# Patient Record
Sex: Female | Born: 1980 | Race: White | Hispanic: No | Marital: Married | Smoking: Never smoker
Health system: Southern US, Community
[De-identification: ages and names within clinical notes are randomized; demographics above are authoritative.]

## PROBLEM LIST (undated history)

## (undated) DIAGNOSIS — F509 Eating disorder, unspecified: Secondary | ICD-10-CM

## (undated) DIAGNOSIS — R51 Headache: Secondary | ICD-10-CM

## (undated) DIAGNOSIS — R519 Headache, unspecified: Secondary | ICD-10-CM

## (undated) DIAGNOSIS — N390 Urinary tract infection, site not specified: Secondary | ICD-10-CM

## (undated) DIAGNOSIS — J302 Other seasonal allergic rhinitis: Secondary | ICD-10-CM

## (undated) DIAGNOSIS — M40209 Unspecified kyphosis, site unspecified: Secondary | ICD-10-CM

## (undated) HISTORY — PX: OTHER SURGICAL HISTORY: SHX169

## (undated) HISTORY — DX: Headache, unspecified: R51.9

## (undated) HISTORY — DX: Eating disorder, unspecified: F50.9

## (undated) HISTORY — DX: Unspecified kyphosis, site unspecified: M40.209

## (undated) HISTORY — DX: Other seasonal allergic rhinitis: J30.2

## (undated) HISTORY — DX: Urinary tract infection, site not specified: N39.0

## (undated) HISTORY — DX: Headache: R51

---

## 2014-08-03 ENCOUNTER — Emergency Department (HOSPITAL_COMMUNITY)
Admission: EM | Admit: 2014-08-03 | Discharge: 2014-08-03 | Disposition: A | Discharge status: Home / Self Care | Payer: BLUE CROSS/BLUE SHIELD | Attending: Emergency Medicine | Admitting: Emergency Medicine

## 2014-08-03 ENCOUNTER — Encounter (HOSPITAL_COMMUNITY): Payer: Self-pay

## 2014-08-03 DIAGNOSIS — K625 Hemorrhage of anus and rectum: Secondary | ICD-10-CM | POA: Diagnosis present

## 2014-08-03 DIAGNOSIS — Z79899 Other long term (current) drug therapy: Secondary | ICD-10-CM | POA: Diagnosis not present

## 2014-08-03 DIAGNOSIS — R5383 Other fatigue: Secondary | ICD-10-CM | POA: Diagnosis not present

## 2014-08-03 LAB — CBC
HCT: 40.1 % (ref 36.0–46.0)
Hemoglobin: 13.4 g/dL (ref 12.0–15.0)
MCH: 30.6 pg (ref 26.0–34.0)
MCHC: 33.4 g/dL (ref 30.0–36.0)
MCV: 91.6 fL (ref 78.0–100.0)
PLATELETS: 169 10*3/uL (ref 150–400)
RBC: 4.38 MIL/uL (ref 3.87–5.11)
RDW: 12.9 % (ref 11.5–15.5)
WBC: 5.9 10*3/uL (ref 4.0–10.5)

## 2014-08-03 LAB — COMPREHENSIVE METABOLIC PANEL
ALBUMIN: 4.1 g/dL (ref 3.5–5.2)
ALT: 14 U/L (ref 0–35)
ANION GAP: 5 (ref 5–15)
AST: 18 U/L (ref 0–37)
Alkaline Phosphatase: 40 U/L (ref 39–117)
BUN: 13 mg/dL (ref 6–23)
CO2: 28 mmol/L (ref 19–32)
Calcium: 9.2 mg/dL (ref 8.4–10.5)
Chloride: 104 mmol/L (ref 96–112)
Creatinine, Ser: 0.77 mg/dL (ref 0.50–1.10)
Glucose, Bld: 99 mg/dL (ref 70–99)
Potassium: 3.7 mmol/L (ref 3.5–5.1)
Sodium: 137 mmol/L (ref 135–145)
TOTAL PROTEIN: 6.7 g/dL (ref 6.0–8.3)
Total Bilirubin: 0.3 mg/dL (ref 0.3–1.2)

## 2014-08-03 LAB — TYPE AND SCREEN
ABO/RH(D): A POS
Antibody Screen: NEGATIVE

## 2014-08-03 LAB — POC OCCULT BLOOD, ED: Fecal Occult Bld: POSITIVE — AB

## 2014-08-03 NOTE — ED Notes (Signed)
MD in room

## 2014-08-03 NOTE — Discharge Instructions (Signed)

## 2014-08-03 NOTE — ED Provider Notes (Signed)
CSN: [id number]     Arrival date & time 08/03/14  2206 History   First MD Initiated Contact with Patient 08/03/14 2214     Chief Complaint  Patient presents with  . Rectal Bleeding     (Consider location/radiation/quality/duration/timing/severity/associated sxs/prior Treatment) HPI Comments: [name] no significant PMHx presents with CC rectal bleeding.  Pt states symptoms started tonight.  She states she has had BRBPR tonight, approximately 1-2 hours prior to arrival.  Blood is scant, no active bleeding currently.  Pt states she did have some abdominal discomfort earlier, but feels this was 2/2 nerves.  Denies fever, chills, lightheadedness, syncope, hematemesis, diarrhea, constipation, bruising, or any other symptoms.  Pt states she has had dark stool since late January, but BRBPR just started tonight.  Denies use of OTC NSAIDs, denies hx of GI bleeding, denies hx of bleeding disorder, denies FMHx of GI bleeding, or CA.  No other concerns.    Patient is a 34 y.o. female presenting with hematochezia. The history is provided by the patient.  Rectal Bleeding Quality:  Bright red Amount:  Scant Duration:  2 hours Timing:  Intermittent Progression:  Unchanged Chronicity:  New Context: spontaneously   Context: not anal fissures, not anal penetration, not constipation, not defecation, not diarrhea, not foreign body, not hemorrhoids, not rectal injury and not rectal pain   Similar prior episodes: no   Relieved by:  Nothing Worsened by:  Nothing tried Ineffective treatments:  None tried Associated symptoms: no abdominal pain, no dizziness, no fever, no hematemesis, no light-headedness, no loss of consciousness, no recent illness and no vomiting   Risk factors: no anticoagulant use, no hx of colorectal cancer, no hx of colorectal surgery, no hx of IBD, no liver disease, no NSAID use and no steroid use     History reviewed. No pertinent past medical history. History reviewed. No pertinent past  surgical history. History reviewed. No pertinent family history. History  Substance Use Topics  . Smoking status: Never Smoker   . Smokeless tobacco: Not on file  . Alcohol Use: Yes   OB History    No data available     Review of Systems  Constitutional: Positive for fatigue. Negative for fever.  Respiratory: Negative for cough and shortness of breath.   Cardiovascular: Negative for chest pain.  Gastrointestinal: Positive for blood in stool, hematochezia and anal bleeding. Negative for nausea, vomiting, abdominal pain, diarrhea, constipation and hematemesis.  Genitourinary: Negative for dysuria, hematuria, vaginal bleeding and vaginal discharge.  Musculoskeletal: Negative for myalgias.  Skin: Negative for rash.  Neurological: Negative for dizziness, loss of consciousness, weakness and light-headedness.  Hematological: Negative for adenopathy. Does not bruise/bleed easily.      Allergies  Review of patient's allergies indicates no known allergies.  Home Medications   Prior to Admission medications   Medication Sig Start Date End Date Taking? Authorizing Provider  levonorgestrel (MIRENA) 20 MCG/24HR IUD 1 each by Intrauterine route once. 2014   Yes Historical Provider, MD   BP 121/85 mmHg  Pulse 85  Temp(Src) 98.4 F (36.9 C) (Oral)  Resp 16  Ht 5\' 11"  (1.803 m)  Wt 200 lb (90.719 kg)  BMI 27.91 kg/m2  SpO2 100% Physical Exam  Constitutional: She is oriented to person, place, and time. She appears well-developed and well-nourished.  HENT:  Head: Normocephalic and atraumatic.  Right Ear: External ear normal.  Left Ear: External ear normal.  Mouth/Throat: Oropharynx is clear and moist.  Eyes: Conjunctivae and EOM are normal.  Pupils are equal, round, and reactive to light.  Neck: Normal range of motion. Neck supple.  Cardiovascular: Normal rate, regular rhythm, normal heart sounds and intact distal pulses.   Pulmonary/Chest: Effort normal and breath sounds normal. No  respiratory distress. She has no wheezes. She has no rales. She exhibits no tenderness.  Abdominal: Soft. Bowel sounds are normal. She exhibits no distension and no mass. There is no tenderness. There is no rebound and no guarding.  Genitourinary:  Grossly positive BRBPR, no active bleeding present, no hemorrhoids or anal fissure, no masses palpated.    Musculoskeletal: Normal range of motion.  Neurological: She is alert and oriented to person, place, and time.  Skin: Skin is warm and dry.  Nursing note and vitals reviewed.   ED Course  Procedures (including critical care time) Labs Review Labs Reviewed  POC OCCULT BLOOD, ED - Abnormal; Notable for the following:    Fecal Occult Bld POSITIVE (*)    All other components within normal limits  CBC  COMPREHENSIVE METABOLIC PANEL  TYPE AND SCREEN  ABO/RH    Imaging Review No results found.   EKG Interpretation None      MDM   Final diagnoses:  Rectal bleeding   [name] no significant PMHx presents with CC rectal bleeding.   Physical exam as above.  VS WNL, BP 121/85.  Pt looks well, nontoxic appearing, joking in room, well perfused.  Denies presyncopal symptoms.  Rectal exam positive for gross blood, but no active bleeding, masses, fissures, hemorrhoids.  Hgb is  13.4.  Pt states stool was dark prior to this, so question slow upper GI bleed.  No risk factors for GI bleed by history above.  Pt stable for d/c home at this time.  Will have patient follow up with GI here, call for appointment tomorrow for outpatient workup of rectal bleeding.  Pt given bleeding precautions, and strict return precautions including syncope, lightheadedness, massive active bleeding.  Jon Gills  I discussed this patient with my attending Dr Jeraldine Loots.   Jon Gills, MD 08/04/14 1191  Gerhard Munch, MD 08/05/14 [phone]

## 2014-08-03 NOTE — ED Notes (Signed)
Pt reports 1 episode of rectal bleeding tonight.  Sts she has noticed since the end of January that her bowel movements have been dark.  Also reports feeling tired.  Sts she has been under a lot of stress since moving here from [place].

## 2014-08-04 LAB — ABO/RH: ABO/RH(D): A POS

## 2015-04-06 ENCOUNTER — Encounter: Payer: Self-pay | Admitting: Adult Health

## 2015-04-06 ENCOUNTER — Ambulatory Visit (INDEPENDENT_AMBULATORY_CARE_PROVIDER_SITE_OTHER): Payer: BLUE CROSS/BLUE SHIELD | Admitting: Adult Health

## 2015-04-06 VITALS — BP 100/70 | Temp 98.7°F | Ht 69.5 in | Wt 198.1 lb

## 2015-04-06 DIAGNOSIS — Z23 Encounter for immunization: Secondary | ICD-10-CM | POA: Diagnosis not present

## 2015-04-06 DIAGNOSIS — Z0000 Encounter for general adult medical examination without abnormal findings: Secondary | ICD-10-CM | POA: Diagnosis not present

## 2015-04-06 DIAGNOSIS — B379 Candidiasis, unspecified: Secondary | ICD-10-CM | POA: Diagnosis not present

## 2015-04-06 MED ORDER — FLUCONAZOLE 150 MG PO TABS: 150.0000 mg | ORAL_TABLET | Freq: Once | ORAL | Status: DC

## 2015-04-06 NOTE — Progress Notes (Signed)
HPI:  Tina Mueller is here to establish care.  Last PCP and physical: June 2016  Has the following chronic problems that require follow up and concerns today:  Yeast Infection Her only concern today is that she thinks that she is getting a yeast infection. Was recently placed on Amoxicillin following a dental procedure. She denies any itching but has discharge.   ROS negative for unless reported above: fevers, chills,feeling poorly, unintentional weight loss, hearing or vision loss, chest pain, palpitations, leg claudication, struggling to breath,Not feeling congested in the chest, no orthopenia, no cough,no wheezing, normal appetite, no soft tissue swelling, no hemoptysis, melena, hematochezia, hematuria, falls, loc, si, or thoughts of self harm.  Immunizations:UTD Diet: Eats healthy Exercise:Walks outside every day with the dog. No regular exercise Pap Smear: She does not have an GYN. Will get one. Had one abnormal, none since.   Past Medical History  Diagnosis Date  . Eating disorder   . Frequent headaches   . Seasonal allergies   . UTI (urinary tract infection)     History reviewed. No pertinent past surgical history.  Family History  Problem Relation Age of Onset  . Arthritis    . Breast cancer    . Lung cancer    . Heart disease    . Hypertension      Social History   Social History  . Marital Status: Married    Spouse Name: N/A  . Number of Children: N/A  . Years of Education: N/A   Social History Main Topics  . Smoking status: Never Smoker   . Smokeless tobacco: None  . Alcohol Use: Yes  . Drug Use: None  . Sexual Activity: Not Asked   Other Topics Concern  . None   Social History Narrative     Current outpatient prescriptions:  .  levonorgestrel (MIRENA) 20 MCG/24HR IUD, 1 each by Intrauterine route once. 2014, Disp: , Rfl:   EXAM:  Filed Vitals:   04/06/15 0954  BP: 100/70  Temp: 98.7 F (37.1 C)    Body mass index is 28.84  kg/(m^2).  GENERAL: vitals reviewed and listed above, alert, oriented, appears well hydrated and in no acute distress.  HEENT: atraumatic, conjunttiva clear, no obvious abnormalities on inspection of external nose and ears  NECK: Neck is soft and supple without masses, no adenopathy or thyromegaly, trachea midline, no JVD. Normal range of motion.   LUNGS: clear to auscultation bilaterally, no wheezes, rales or rhonchi, good air movement  CV: Regular rate and rhythm, normal S1/S2, no audible murmurs, gallops, or rubs. No carotid bruit and no peripheral edema.   MS: moves all extremities without noticeable abnormality. No edema noted  Abd: soft/nontender/nondistended/normal bowel sounds   Skin: warm and dry, no rash   Extremities: No clubbing, cyanosis, or edema. Capillary refill is WNL. Pulses intact bilaterally in upper and lower extremities.   Neuro: CN II-XII intact, sensation and reflexes normal throughout, 5/5 muscle strength in bilateral upper and lower extremities. Normal finger to nose. Normal rapid alternating movements. Normal romberg. No pronator drift.   PSYCH: pleasant and cooperative, no obvious depression or anxiety  ASSESSMENT AND PLAN:  1. Routine general medical examination at a health care facility - Follow up in 10/2015 for CPE - Follow up sooner if needed - Stressed importance of regular exercise and healthy diet with portion control.  - Review labs from previous physical, nothing abnormal.  ( scanned into chart)  2. Yeast infection - fluconazole (DIFLUCAN) 150  MG tablet; Take 1 tablet (150 mg total) by mouth once.  Dispense: 1 tablet; Refill: 1 - Can take an additional dose in 72 hours  3. Encounter for immunization - Flu vaccination given     -We reviewed the PMH, PSH, FH, SH, Meds and Allergies. -We provided refills for any medications we will prescribe as needed. -We addressed current concerns per orders and patient instructions. -We have asked for  records for pertinent exams, studies, vaccines and notes from previous providers. -We have advised patient to follow up per instructions below.   -Patient advised to return or notify a provider immediately if symptoms worsen or persist or new concerns arise.   [name], AGNP

## 2015-04-06 NOTE — Patient Instructions (Addendum)
It was a pleasure meeting you today!  Please follow up with me in May 2017 for your next physical. Follow up sooner if needed  Work on diet and exercise.   I have sent in a prescription for Fluconazole, take one pill and if needed another in 72 hours.     Health Maintenance, Female Adopting a healthy lifestyle and getting preventive care can go a long way to promote health and wellness. Talk with your health care provider about what schedule of regular examinations is right for you. This is a good chance for you to check in with your provider about disease prevention and staying healthy. In between checkups, there are plenty of things you can do on your own. Experts have done a lot of research about which lifestyle changes and preventive measures are most likely to keep you healthy. Ask your health care provider for more information. WEIGHT AND DIET  Eat a healthy diet  Be sure to include plenty of vegetables, fruits, low-fat dairy products, and lean protein.  Do not eat a lot of foods high in solid fats, added sugars, or salt.  Get regular exercise. This is one of the most important things you can do for your health.  Most adults should exercise for at least 150 minutes each week. The exercise should increase your heart rate and make you sweat (moderate-intensity exercise).  Most adults should also do strengthening exercises at least twice a week. This is in addition to the moderate-intensity exercise.  Maintain a healthy weight  Body mass index (BMI) is a measurement that can be used to identify possible weight problems. It estimates body fat based on height and weight. Your health care provider can help determine your BMI and help you achieve or maintain a healthy weight.  For females 17 years of age and older:   A BMI below 18.5 is considered underweight.  A BMI of 18.5 to 24.9 is normal.  A BMI of 25 to 29.9 is considered overweight.  A BMI of 30 and above is considered  obese.  Watch levels of cholesterol and blood lipids  You should start having your blood tested for lipids and cholesterol at 33 years of age, then have this test every 5 years.  You may need to have your cholesterol levels checked more often if:  Your lipid or cholesterol levels are high.  You are older than 33 years of age.  You are at high risk for heart disease.  CANCER SCREENING   Lung Cancer  Lung cancer screening is recommended for adults 58-43 years old who are at high risk for lung cancer because of a history of smoking.  A yearly low-dose CT scan of the lungs is recommended for people who:  Currently smoke.  Have quit within the past 15 years.  Have at least a 30-pack-year history of smoking. A pack year is smoking an average of one pack of cigarettes a day for 1 year.  Yearly screening should continue until it has been 15 years since you quit.  Yearly screening should stop if you develop a health problem that would prevent you from having lung cancer treatment.  Breast Cancer  Practice breast self-awareness. This means understanding how your breasts normally appear and feel.  It also means doing regular breast self-exams. Let your health care provider know about any changes, no matter how small.  If you are in your 20s or 30s, you should have a clinical breast exam (CBE) by a health  care provider every 1-3 years as part of a regular health exam.  If you are 34 or older, have a CBE every year. Also consider having a breast X-ray (mammogram) every year.  If you have a family history of breast cancer, talk to your health care provider about genetic screening.  If you are at high risk for breast cancer, talk to your health care provider about having an MRI and a mammogram every year.  Breast cancer gene (BRCA) assessment is recommended for women who have family members with BRCA-related cancers. BRCA-related cancers  include:  Breast.  Ovarian.  Tubal.  Peritoneal cancers.  Results of the assessment will determine the need for genetic counseling and BRCA1 and BRCA2 testing. Cervical Cancer Your health care provider may recommend that you be screened regularly for cancer of the pelvic organs (ovaries, uterus, and vagina). This screening involves a pelvic examination, including checking for microscopic changes to the surface of your cervix (Pap test). You may be encouraged to have this screening done every 3 years, beginning at age 21.  For women ages 89-65, health care providers may recommend pelvic exams and Pap testing every 3 years, or they may recommend the Pap and pelvic exam, combined with testing for human papilloma virus (HPV), every 5 years. Some types of HPV increase your risk of cervical cancer. Testing for HPV may also be done on women of any age with unclear Pap test results.  Other health care providers may not recommend any screening for nonpregnant women who are considered low risk for pelvic cancer and who do not have symptoms. Ask your health care provider if a screening pelvic exam is right for you.  If you have had past treatment for cervical cancer or a condition that could lead to cancer, you need Pap tests and screening for cancer for at least 20 years after your treatment. If Pap tests have been discontinued, your risk factors (such as having a new sexual partner) need to be reassessed to determine if screening should resume. Some women have medical problems that increase the chance of getting cervical cancer. In these cases, your health care provider may recommend more frequent screening and Pap tests. Colorectal Cancer  This type of cancer can be detected and often prevented.  Routine colorectal cancer screening usually begins at 33 years of age and continues through 33 years of age.  Your health care provider may recommend screening at an earlier age if you have risk factors for  colon cancer.  Your health care provider may also recommend using home test kits to check for hidden blood in the stool.  A small camera at the end of a tube can be used to examine your colon directly (sigmoidoscopy or colonoscopy). This is done to check for the earliest forms of colorectal cancer.  Routine screening usually begins at age 36.  Direct examination of the colon should be repeated every 5-10 years through 33 years of age. However, you may need to be screened more often if early forms of precancerous polyps or small growths are found. Skin Cancer  Check your skin from head to toe regularly.  Tell your health care provider about any new moles or changes in moles, especially if there is a change in a mole's shape or color.  Also tell your health care provider if you have a mole that is larger than the size of a pencil eraser.  Always use sunscreen. Apply sunscreen liberally and repeatedly throughout the day.  Protect  yourself by wearing long sleeves, pants, a wide-brimmed hat, and sunglasses whenever you are outside. HEART DISEASE, DIABETES, AND HIGH BLOOD PRESSURE   High blood pressure causes heart disease and increases the risk of stroke. High blood pressure is more likely to develop in:  People who have blood pressure in the high end of the normal range (130-139/85-89 mm Hg).  People who are overweight or obese.  People who are African American.  If you are 54-65 years of age, have your blood pressure checked every 3-5 years. If you are 81 years of age or older, have your blood pressure checked every year. You should have your blood pressure measured twice--once when you are at a hospital or clinic, and once when you are not at a hospital or clinic. Record the average of the two measurements. To check your blood pressure when you are not at a hospital or clinic, you can use:  An automated blood pressure machine at a pharmacy.  A home blood pressure monitor.  If you  are between 81 years and 18 years old, ask your health care provider if you should take aspirin to prevent strokes.  Have regular diabetes screenings. This involves taking a blood sample to check your fasting blood sugar level.  If you are at a normal weight and have a low risk for diabetes, have this test once every three years after 33 years of age.  If you are overweight and have a high risk for diabetes, consider being tested at a younger age or more often. PREVENTING INFECTION  Hepatitis B  If you have a higher risk for hepatitis B, you should be screened for this virus. You are considered at high risk for hepatitis B if:  You were born in a country where hepatitis B is common. Ask your health care provider which countries are considered high risk.  Your parents were born in a high-risk country, and you have not been immunized against hepatitis B (hepatitis B vaccine).  You have HIV or AIDS.  You use needles to inject street drugs.  You live with someone who has hepatitis B.  You have had sex with someone who has hepatitis B.  You get hemodialysis treatment.  You take certain medicines for conditions, including cancer, organ transplantation, and autoimmune conditions. Hepatitis C  Blood testing is recommended for:  Everyone born from [birth year] through 1964.  Anyone with known risk factors for hepatitis C. Sexually transmitted infections (STIs)  You should be screened for sexually transmitted infections (STIs) including gonorrhea and chlamydia if:  You are sexually active and are younger than 33 years of age.  You are older than 33 years of age and your health care provider tells you that you are at risk for this type of infection.  Your sexual activity has changed since you were last screened and you are at an increased risk for chlamydia or gonorrhea. Ask your health care provider if you are at risk.  If you do not have HIV, but are at risk, it may be recommended that you  take a prescription medicine daily to prevent HIV infection. This is called pre-exposure prophylaxis (PrEP). You are considered at risk if:  You are sexually active and do not regularly use condoms or know the HIV status of your partner(s).  You take drugs by injection.  You are sexually active with a partner who has HIV. Talk with your health care provider about whether you are at high risk of being infected with  HIV. If you choose to begin PrEP, you should first be tested for HIV. You should then be tested every 3 months for as long as you are taking PrEP.  PREGNANCY   If you are premenopausal and you may become pregnant, ask your health care provider about preconception counseling.  If you may become pregnant, take 400 to 800 micrograms (mcg) of folic acid every day.  If you want to prevent pregnancy, talk to your health care provider about birth control (contraception). OSTEOPOROSIS AND MENOPAUSE   Osteoporosis is a disease in which the bones lose minerals and strength with aging. This can result in serious bone fractures. Your risk for osteoporosis can be identified using a bone density scan.  If you are 53 years of age or older, or if you are at risk for osteoporosis and fractures, ask your health care provider if you should be screened.  Ask your health care provider whether you should take a calcium or vitamin D supplement to lower your risk for osteoporosis.  Menopause may have certain physical symptoms and risks.  Hormone replacement therapy may reduce some of these symptoms and risks. Talk to your health care provider about whether hormone replacement therapy is right for you.  HOME CARE INSTRUCTIONS   Schedule regular health, dental, and eye exams.  Stay current with your immunizations.   Do not use any tobacco products including cigarettes, chewing tobacco, or electronic cigarettes.  If you are pregnant, do not drink alcohol.  If you are breastfeeding, limit how  much and how often you drink alcohol.  Limit alcohol intake to no more than 1 drink per day for nonpregnant women. One drink equals 12 ounces of beer, 5 ounces of wine, or 1 ounces of hard liquor.  Do not use street drugs.  Do not share needles.  Ask your health care provider for help if you need support or information about quitting drugs.  Tell your health care provider if you often feel depressed.  Tell your health care provider if you have ever been abused or do not feel safe at home.   This information is not intended to replace advice given to you by your health care provider. Make sure you discuss any questions you have with your health care provider.   Document Released: 12/19/2010 Document Revised: 06/26/2014 Document Reviewed: 05/07/2013 Elsevier Interactive Patient Education [employer].

## 2015-12-27 LAB — OB RESULTS CONSOLE ANTIBODY SCREEN: ANTIBODY SCREEN: NEGATIVE

## 2015-12-27 LAB — OB RESULTS CONSOLE HIV ANTIBODY (ROUTINE TESTING): HIV: NONREACTIVE

## 2015-12-27 LAB — OB RESULTS CONSOLE HEPATITIS B SURFACE ANTIGEN: HEP B S AG: NEGATIVE

## 2015-12-27 LAB — OB RESULTS CONSOLE GC/CHLAMYDIA
CHLAMYDIA, DNA PROBE: NEGATIVE
Gonorrhea: NEGATIVE

## 2015-12-27 LAB — OB RESULTS CONSOLE ABO/RH: RH TYPE: POSITIVE

## 2015-12-27 LAB — OB RESULTS CONSOLE RUBELLA ANTIBODY, IGM: Rubella: IMMUNE

## 2015-12-27 LAB — OB RESULTS CONSOLE RPR: RPR: NONREACTIVE

## 2016-06-19 NOTE — L&D Delivery Note (Signed)
Delivery Note At 8:06 PM a viable female was delivered via Vaginal, Spontaneous Delivery (PresentationOA: ;  ).  APGAR: , ; weight  .   Placenta status:spont >>intact , .  Cord:  with the following complications: .  Cord pH: sent Mod tight nuchal cord X 1>>cclamped + cut Anesthesia: epid + loc  Episiotomy: None Lacerations: 2nd degree;Perineal, bilat periurethral lacs Suture Repair: 3.0 vicryl rapide Est. Blood Loss (mL): 300  Mom to postpartum.  Baby to Couplet care / Skin to Skin.  Tina Mueller,Alie Hardgrove M 08/04/2016, 8:34 PM

## 2016-07-25 ENCOUNTER — Encounter (HOSPITAL_COMMUNITY): Payer: Self-pay | Admitting: *Deleted

## 2016-07-25 ENCOUNTER — Telehealth (HOSPITAL_COMMUNITY): Payer: Self-pay | Admitting: *Deleted

## 2016-07-25 LAB — OB RESULTS CONSOLE GBS: GBS: POSITIVE

## 2016-07-25 NOTE — Telephone Encounter (Signed)
Preadmission screen  

## 2016-08-03 NOTE — H&P (Signed)
Tina Mueller  DICTATION # [id number] CSN# [id number]   Tina PicaHOLLAND,[name], MD 08/03/2016 11:04 AM

## 2016-08-04 ENCOUNTER — Inpatient Hospital Stay (HOSPITAL_COMMUNITY): Payer: BLUE CROSS/BLUE SHIELD | Admitting: Anesthesiology

## 2016-08-04 ENCOUNTER — Encounter (HOSPITAL_COMMUNITY): Payer: Self-pay

## 2016-08-04 ENCOUNTER — Inpatient Hospital Stay (HOSPITAL_COMMUNITY)
Admission: RE | Admit: 2016-08-04 | Discharge: 2016-08-06 | DRG: 775 | Disposition: A | Discharge status: Home / Self Care | Payer: BLUE CROSS/BLUE SHIELD | Source: Ambulatory Visit | Attending: Obstetrics and Gynecology | Admitting: Obstetrics and Gynecology

## 2016-08-04 DIAGNOSIS — O99214 Obesity complicating childbirth: Secondary | ICD-10-CM | POA: Diagnosis present

## 2016-08-04 DIAGNOSIS — Z6839 Body mass index (BMI) 39.0-39.9, adult: Secondary | ICD-10-CM

## 2016-08-04 DIAGNOSIS — Z3493 Encounter for supervision of normal pregnancy, unspecified, third trimester: Secondary | ICD-10-CM | POA: Diagnosis present

## 2016-08-04 DIAGNOSIS — O99824 Streptococcus B carrier state complicating childbirth: Secondary | ICD-10-CM | POA: Diagnosis present

## 2016-08-04 DIAGNOSIS — Z3490 Encounter for supervision of normal pregnancy, unspecified, unspecified trimester: Secondary | ICD-10-CM

## 2016-08-04 DIAGNOSIS — Z3A40 40 weeks gestation of pregnancy: Secondary | ICD-10-CM | POA: Diagnosis not present

## 2016-08-04 HISTORY — DX: Encounter for supervision of normal pregnancy, unspecified, unspecified trimester: Z34.90

## 2016-08-04 LAB — CBC
HCT: 38.9 % (ref 36.0–46.0)
HEMATOCRIT: 38.1 % (ref 36.0–46.0)
HEMOGLOBIN: 13.2 g/dL (ref 12.0–15.0)
Hemoglobin: 12.6 g/dL (ref 12.0–15.0)
MCH: 30.1 pg (ref 26.0–34.0)
MCH: 30.1 pg (ref 26.0–34.0)
MCHC: 33.1 g/dL (ref 30.0–36.0)
MCHC: 33.9 g/dL (ref 30.0–36.0)
MCV: 90.9 fL (ref 78.0–100.0)
MCV: 88.8 fL (ref 78.0–100.0)
PLATELETS: 125 10*3/uL — AB (ref 150–400)
Platelets: 132 10*3/uL — ABNORMAL LOW (ref 150–400)
RBC: 4.19 MIL/uL (ref 3.87–5.11)
RBC: 4.38 MIL/uL (ref 3.87–5.11)
RDW: 14 % (ref 11.5–15.5)
RDW: 14 % (ref 11.5–15.5)
WBC: 10.4 10*3/uL (ref 4.0–10.5)
WBC: 13.2 10*3/uL — AB (ref 4.0–10.5)

## 2016-08-04 LAB — TYPE AND SCREEN
ABO/RH(D): A POS
ANTIBODY SCREEN: NEGATIVE

## 2016-08-04 LAB — ABO/RH: ABO/RH(D): A POS

## 2016-08-04 MED ORDER — COCONUT OIL OIL
1.0000 "application " | TOPICAL_OIL | Status: DC | PRN
  Administered 2016-08-06: 1 via TOPICAL
  Filled 2016-08-04: qty 120

## 2016-08-04 MED ORDER — ACETAMINOPHEN 325 MG PO TABS
650.0000 mg | ORAL_TABLET | ORAL | Status: DC | PRN
  Administered 2016-08-05 (×2): 650 mg via ORAL
  Filled 2016-08-04 (×2): qty 2

## 2016-08-04 MED ORDER — DIPHENHYDRAMINE HCL 25 MG PO CAPS: 25.0000 mg | ORAL_CAPSULE | Freq: Four times a day (QID) | ORAL | Status: DC | PRN

## 2016-08-04 MED ORDER — FENTANYL 2.5 MCG/ML BUPIVACAINE 1/10 % EPIDURAL INFUSION (WH - ANES)
14.0000 mL/h | INTRAMUSCULAR | Status: DC | PRN
  Administered 2016-08-04 (×2): 14 mL/h via EPIDURAL
  Filled 2016-08-04 (×2): qty 100

## 2016-08-04 MED ORDER — OXYTOCIN 40 UNITS IN LACTATED RINGERS INFUSION - SIMPLE MED: 2.5000 [IU]/h | INTRAVENOUS | Status: DC

## 2016-08-04 MED ORDER — SIMETHICONE 80 MG PO CHEW
80.0000 mg | CHEWABLE_TABLET | ORAL | Status: DC | PRN
  Administered 2016-08-05: 80 mg via ORAL
  Filled 2016-08-04: qty 1

## 2016-08-04 MED ORDER — OXYCODONE-ACETAMINOPHEN 5-325 MG PO TABS: 1.0000 | ORAL_TABLET | ORAL | Status: DC | PRN

## 2016-08-04 MED ORDER — BISACODYL 10 MG RE SUPP: 10.0000 mg | Freq: Every day | RECTAL | Status: DC | PRN

## 2016-08-04 MED ORDER — FLEET ENEMA 7-19 GM/118ML RE ENEM: 1.0000 | ENEMA | Freq: Every day | RECTAL | Status: DC | PRN

## 2016-08-04 MED ORDER — OXYTOCIN BOLUS FROM INFUSION
500.0000 mL | Freq: Once | INTRAVENOUS | Status: AC
  Administered 2016-08-04: 500 mL via INTRAVENOUS

## 2016-08-04 MED ORDER — IBUPROFEN 800 MG PO TABS
800.0000 mg | ORAL_TABLET | Freq: Three times a day (TID) | ORAL | Status: DC | PRN
Start: 2016-08-04 — End: 2016-08-06
  Administered 2016-08-05 (×3): 800 mg via ORAL
  Filled 2016-08-04 (×3): qty 1

## 2016-08-04 MED ORDER — TETANUS-DIPHTH-ACELL PERTUSSIS 5-2.5-18.5 LF-MCG/0.5 IM SUSP: 0.5000 mL | Freq: Once | INTRAMUSCULAR | Status: DC

## 2016-08-04 MED ORDER — DIPHENHYDRAMINE HCL 50 MG/ML IJ SOLN: 12.5000 mg | INTRAMUSCULAR | Status: DC | PRN

## 2016-08-04 MED ORDER — TERBUTALINE SULFATE 1 MG/ML IJ SOLN
0.2500 mg | Freq: Once | INTRAMUSCULAR | Status: DC | PRN
  Filled 2016-08-04: qty 1

## 2016-08-04 MED ORDER — EPHEDRINE 5 MG/ML INJ
10.0000 mg | INTRAVENOUS | Status: DC | PRN
  Filled 2016-08-04: qty 4

## 2016-08-04 MED ORDER — FLEET ENEMA 7-19 GM/118ML RE ENEM: 1.0000 | ENEMA | RECTAL | Status: DC | PRN

## 2016-08-04 MED ORDER — ONDANSETRON HCL 4 MG PO TABS
4.0000 mg | ORAL_TABLET | ORAL | Status: DC | PRN
Start: 2016-08-04 — End: 2016-08-06

## 2016-08-04 MED ORDER — MISOPROSTOL 25 MCG QUARTER TABLET
25.0000 ug | ORAL_TABLET | ORAL | Status: DC | PRN
  Administered 2016-08-04 (×2): 25 ug via VAGINAL
  Filled 2016-08-04 (×2): qty 0.25
  Filled 2016-08-04: qty 1

## 2016-08-04 MED ORDER — ACETAMINOPHEN 325 MG PO TABS: 650.0000 mg | ORAL_TABLET | ORAL | Status: DC | PRN

## 2016-08-04 MED ORDER — ONDANSETRON HCL 4 MG/2ML IJ SOLN: 4.0000 mg | INTRAMUSCULAR | Status: DC | PRN

## 2016-08-04 MED ORDER — LIDOCAINE HCL (PF) 1 % IJ SOLN
30.0000 mL | INTRAMUSCULAR | Status: DC | PRN
  Administered 2016-08-04: 30 mL via SUBCUTANEOUS
  Filled 2016-08-04: qty 30

## 2016-08-04 MED ORDER — LACTATED RINGERS IV SOLN: 500.0000 mL | Freq: Once | INTRAVENOUS | Status: DC

## 2016-08-04 MED ORDER — LACTATED RINGERS IV SOLN
INTRAVENOUS | Status: DC
  Administered 2016-08-04 (×2): 125 mL/h via INTRAVENOUS

## 2016-08-04 MED ORDER — OXYCODONE-ACETAMINOPHEN 5-325 MG PO TABS: 2.0000 | ORAL_TABLET | ORAL | Status: DC | PRN

## 2016-08-04 MED ORDER — ONDANSETRON HCL 4 MG/2ML IJ SOLN: 4.0000 mg | Freq: Four times a day (QID) | INTRAMUSCULAR | Status: DC | PRN

## 2016-08-04 MED ORDER — WITCH HAZEL-GLYCERIN EX PADS: 1.0000 "application " | MEDICATED_PAD | CUTANEOUS | Status: DC | PRN

## 2016-08-04 MED ORDER — BENZOCAINE-MENTHOL 20-0.5 % EX AERO
1.0000 "application " | INHALATION_SPRAY | CUTANEOUS | Status: DC | PRN
  Filled 2016-08-04 (×2): qty 56

## 2016-08-04 MED ORDER — PRENATAL MULTIVITAMIN CH
1.0000 | ORAL_TABLET | Freq: Every day | ORAL | Status: DC
Start: 2016-08-05 — End: 2016-08-06
  Administered 2016-08-05: 1 via ORAL
  Filled 2016-08-04: qty 1

## 2016-08-04 MED ORDER — LIDOCAINE HCL (PF) 1 % IJ SOLN
INTRAMUSCULAR | Status: DC | PRN
  Administered 2016-08-04: 8 mL via EPIDURAL
  Administered 2016-08-04: 6 mL via EPIDURAL

## 2016-08-04 MED ORDER — SENNOSIDES-DOCUSATE SODIUM 8.6-50 MG PO TABS
2.0000 | ORAL_TABLET | ORAL | Status: DC
  Administered 2016-08-05: 2 via ORAL
  Filled 2016-08-04 (×2): qty 2

## 2016-08-04 MED ORDER — ZOLPIDEM TARTRATE 5 MG PO TABS: 5.0000 mg | ORAL_TABLET | Freq: Every evening | ORAL | Status: DC | PRN

## 2016-08-04 MED ORDER — DIBUCAINE 1 % RE OINT: 1.0000 "application " | TOPICAL_OINTMENT | RECTAL | Status: DC | PRN

## 2016-08-04 MED ORDER — PENICILLIN G POT IN DEXTROSE 60000 UNIT/ML IV SOLN
3.0000 10*6.[IU] | INTRAVENOUS | Status: DC
  Administered 2016-08-04 (×2): 3 10*6.[IU] via INTRAVENOUS
  Filled 2016-08-04 (×6): qty 50

## 2016-08-04 MED ORDER — PENICILLIN G POTASSIUM 5000000 UNITS IJ SOLR
5.0000 10*6.[IU] | Freq: Once | INTRAVENOUS | Status: AC
  Administered 2016-08-04: 5 10*6.[IU] via INTRAVENOUS
  Filled 2016-08-04: qty 5

## 2016-08-04 MED ORDER — PHENYLEPHRINE 40 MCG/ML (10ML) SYRINGE FOR IV PUSH (FOR BLOOD PRESSURE SUPPORT)
80.0000 ug | PREFILLED_SYRINGE | INTRAVENOUS | Status: DC | PRN
  Filled 2016-08-04: qty 10
  Filled 2016-08-04: qty 5

## 2016-08-04 MED ORDER — ZOLPIDEM TARTRATE 5 MG PO TABS
5.0000 mg | ORAL_TABLET | Freq: Every evening | ORAL | Status: DC | PRN
Start: 2016-08-04 — End: 2016-08-06

## 2016-08-04 MED ORDER — PHENYLEPHRINE 40 MCG/ML (10ML) SYRINGE FOR IV PUSH (FOR BLOOD PRESSURE SUPPORT)
80.0000 ug | PREFILLED_SYRINGE | INTRAVENOUS | Status: DC | PRN
  Filled 2016-08-04: qty 5

## 2016-08-04 MED ORDER — SOD CITRATE-CITRIC ACID 500-334 MG/5ML PO SOLN: 30.0000 mL | ORAL | Status: DC | PRN

## 2016-08-04 MED ORDER — OXYTOCIN 40 UNITS IN LACTATED RINGERS INFUSION - SIMPLE MED
1.0000 m[IU]/min | INTRAVENOUS | Status: DC
  Administered 2016-08-04: 2 m[IU]/min via INTRAVENOUS
  Filled 2016-08-04: qty 1000

## 2016-08-04 MED ORDER — MEASLES, MUMPS & RUBELLA VAC ~~LOC~~ INJ
0.5000 mL | INJECTION | Freq: Once | SUBCUTANEOUS | Status: DC
  Filled 2016-08-04: qty 0.5

## 2016-08-04 MED ORDER — LACTATED RINGERS IV SOLN
500.0000 mL | INTRAVENOUS | Status: DC | PRN
  Administered 2016-08-04: 500 mL via INTRAVENOUS

## 2016-08-04 NOTE — Lactation Note (Signed)
This note was copied from a baby's chart. Lactation Consultation Note  Patient Name: Tina Mueller YQMVH'QToday's Date: 08/04/2016 Reason for consult: Initial assessment Baby at 45 minutes of life. Upon entry baby was in the CN because of grunting. Mom "really wants to bf" this baby because bf did not go well with her older child. She reports her older child did not latch and when he did he made her nipples bleed. Offered to set a DEBP but she declined. She stated baby would be back to her in the next 30 minutes. Instructed to her call for latch help. Discussed baby behavior, feeding frequency, baby belly size, voids, wt loss, breast changes, and nipple care. Demonstrated manual expression, colostrum noted bilaterally. Given lactation handouts. Aware of OP services and support group.     Maternal Data Has patient been taught Hand Expression?: Yes Does the patient have breastfeeding experience prior to this delivery?: Yes  Feeding    LATCH Score/Interventions                      Lactation Tools Discussed/Used     Consult Status Consult Status: Follow-up Date: 08/05/16 Follow-up type: In-patient    Tina Mueller 08/04/2016, 9:14 PM

## 2016-08-04 NOTE — Anesthesia Pain Management Evaluation Note (Signed)
  CRNA Pain Management Visit Note  Patient: Tina BlaseElizabeth Ricard, 36 y.o., female  "Hello I am a member of the anesthesia team at [facility]. We have an anesthesia team available at all times to provide care throughout the hospital, including epidural management and anesthesia for C-section. I don't know your plan for the delivery whether it a natural birth, water birth, IV sedation, nitrous supplementation, doula or epidural, but we want to meet your pain goals."   1.Was your pain managed to your expectations on prior hospitalizations?   Yes   2.What is your expectation for pain management during this hospitalization?     Epidural  3.How can we help you reach that goal? Patient would like epidural when pain gets worse  Record the patient's initial score and the patient's pain goal.   Pain: 1  Pain Goal: 5 [facility] wants you to be able to say your pain was always managed very well.  Rica RecordsICKELTON,Harvy Riera 08/04/2016

## 2016-08-04 NOTE — H&P (Signed)
NAMImogene Mueller:  Iribe, LIZABETH               ACCOUNT NO.:  [id number]  MEDICAL RECORD NO.:  [id number]  LOCATION:                                 FACILITY:  PHYSICIAN:  Duke Salviaichard M. Marcelle OverlieHolland, M.D.    DATE OF BIRTH:  DATE OF ADMISSION:  08/04/2016 DATE OF DISCHARGE:                             HISTORY & PHYSICAL   CHIEF COMPLAINT:  For labor induction at term.  HISTORY OF PRESENT ILLNESS:  A 36 year old, G3, P1-0-1-1, previous vaginal delivery in 2008, an 8 pounds 11 ounces female, [place]  Her 1-hour GTT this pregnancy was 85.  GBS was positive, sensitive to penicillin.  Her CF and SMA screen were negative.  Panorama was normal female.  The protocol for 2-stage labor induction reviewed with her, which she understands and accepts.  Blood type is a positive.  For the remainder of her past medical history, please see the [facility] form for details.  PHYSICAL EXAMINATION:  VITAL SIGNS:  Temp 98.2 and blood pressure 128/88. HEENT:  Unremarkable. NECK:  Supple without masses. LUNGS:  Clear. CARDIOVASCULAR:  Regular rate and rhythm without murmurs, rubs, or gallops. BREASTS:  Not examined. ABDOMEN:  Term fundal height.  Fetal heart rate was 142.  Cervix was 2, 50%, -2.  Most recent ultrasound, EFW 8 pounds 10 ounces. EXTREMITIES:  Exam unremarkable. NEUROLOGIC:  Exam unremarkable.  IMPRESSION:  Term pregnancy, favorable cervix for labor induction.  PLAN:  Cytotec 2-stage labor induction.     Hersh Minney M. Marcelle OverlieHolland, M.D.   ______________________________ Duke Salviaichard M. Marcelle OverlieHolland, M.D.    RMH/MEDQ  D:  08/03/2016  T:  08/03/2016  Job:  [id number]

## 2016-08-04 NOTE — Anesthesia Procedure Notes (Signed)
Epidural Patient location during procedure: OB Start time: 08/04/2016 12:27 PM End time: 08/04/2016 12:30 PM  Staffing Anesthesiologist: Leilani AbleHATCHETT, Tessica Cupo Performed: anesthesiologist   Preanesthetic Checklist Completed: patient identified, surgical consent, pre-op evaluation, timeout performed, IV checked, risks and benefits discussed and monitors and equipment checked  Epidural Patient position: sitting Prep: site prepped and draped and DuraPrep Patient monitoring: continuous pulse ox and blood pressure Approach: midline Location: L3-L4 Injection technique: LOR air  Needle:  Needle type: Tuohy  Needle gauge: 17 G Needle length: 9 cm and 9 Needle insertion depth: 7 cm Catheter type: closed end flexible Catheter size: 19 Gauge Catheter at skin depth: 13 cm Test dose: negative and Other  Assessment Sensory level: T9 Events: blood not aspirated, injection not painful, no injection resistance, negative IV test and no paresthesia  Additional Notes Reason for block:procedure for pain

## 2016-08-04 NOTE — Anesthesia Preprocedure Evaluation (Signed)
Anesthesia Evaluation  Patient identified by MRN, date of birth, ID band Patient awake    Reviewed: Allergy & Precautions, H&P , Patient's Chart, lab work & pertinent test results  Airway Mallampati: II  TM Distance: >3 FB Neck ROM: full    Dental no notable dental hx.    Pulmonary neg pulmonary ROS,    Pulmonary exam normal breath sounds clear to auscultation       Cardiovascular negative cardio ROS Normal cardiovascular exam     Neuro/Psych negative neurological ROS     GI/Hepatic negative GI ROS, Neg liver ROS,   Endo/Other  Morbid obesity  Renal/GU negative Renal ROS     Musculoskeletal   Abdominal   Peds  Hematology negative hematology ROS (+)   Anesthesia Other Findings   Reproductive/Obstetrics (+) Pregnancy                             Anesthesia Physical Anesthesia Plan  ASA: III  Anesthesia Plan: Epidural   Post-op Pain Management:    Induction:   Airway Management Planned:   Additional Equipment:   Intra-op Plan:   Post-operative Plan:   Informed Consent: I have reviewed the patients History and Physical, chart, labs and discussed the procedure including the risks, benefits and alternatives for the proposed anesthesia with the patient or authorized representative who has indicated his/her understanding and acceptance.     Plan Discussed with:   Anesthesia Plan Comments:         Anesthesia Quick Evaluation

## 2016-08-04 NOTE — Progress Notes (Signed)
Received epid, now on pit 6 mu/min, AROM>>clear, IUPC placed, cx 3/50/-2, FHR cat I

## 2016-08-05 LAB — CBC
HEMATOCRIT: 34.4 % — AB (ref 36.0–46.0)
Hemoglobin: 11.7 g/dL — ABNORMAL LOW (ref 12.0–15.0)
MCH: 30 pg (ref 26.0–34.0)
MCHC: 34 g/dL (ref 30.0–36.0)
MCV: 88.2 fL (ref 78.0–100.0)
Platelets: 125 10*3/uL — ABNORMAL LOW (ref 150–400)
RBC: 3.9 MIL/uL (ref 3.87–5.11)
RDW: 14.1 % (ref 11.5–15.5)
WBC: 15 10*3/uL — AB (ref 4.0–10.5)

## 2016-08-05 LAB — RPR: RPR Ser Ql: NONREACTIVE

## 2016-08-05 NOTE — Progress Notes (Signed)
Post Partum Day 1 Subjective: no complaints  Objective: Blood pressure 137/84, pulse 93, temperature 98.1 F (36.7 C), temperature source Oral, resp. rate 18, height 5\' 11"  (1.803 m), weight 127 kg (280 lb), SpO2 99 %, unknown if currently breastfeeding.  Physical Exam:  General: alert Lochia: appropriate Uterine Fundus: firm Incision: healing well DVT Evaluation: No evidence of DVT seen on physical exam.   Recent Labs  08/04/16 1154 08/05/16 0540  HGB 13.2 11.7*  HCT 38.9 34.4*    Assessment/Plan: Plan for discharge tomorrow   LOS: 1 day   Viktorya Arguijo M 08/05/2016, 8:38 AM

## 2016-08-05 NOTE — Lactation Note (Signed)
This note was copied from a baby's chart. Lactation Consultation Note  Patient Name: Tina Mueller ZOXWR'UToday's Date: 08/05/2016  Mom sleeping when I entered room.  She woke and states that the last 2 breastfeedings went well.  Baby is now 2020 hours old.  Encouraged to watch for feeding cues and feed baby with any cue.  Instructed to call for assist/concerns prn.   Maternal Data    Feeding Feeding Type: Breast Fed  LATCH Score/Interventions Latch: Repeated attempts needed to sustain latch, nipple held in mouth throughout feeding, stimulation needed to elicit sucking reflex.  Audible Swallowing: None  Type of Nipple: Everted at rest and after stimulation  Comfort (Breast/Nipple): Soft / non-tender     Hold (Positioning): Assistance needed to correctly position infant at breast and maintain latch.  LATCH Score: 6  Lactation Tools Discussed/Used     Consult Status      Huston FoleyMOULDEN, [facility] S 08/05/2016, 4:17 PM

## 2016-08-05 NOTE — Anesthesia Postprocedure Evaluation (Signed)
Anesthesia Post Note  Patient: Tina BlaseElizabeth Odoherty  Procedure(s) Performed: * No procedures listed *  Patient location during evaluation: Mother Baby Anesthesia Type: Epidural Level of consciousness: awake and alert Pain management: pain level controlled Vital Signs Assessment: post-procedure vital signs reviewed and stable Respiratory status: spontaneous breathing, nonlabored ventilation and respiratory function stable Cardiovascular status: stable Postop Assessment: no headache, no backache, epidural receding and patient able to bend at knees Anesthetic complications: no        Last Vitals:  Vitals:   08/05/16 0345 08/05/16 0536  BP: 119/64 137/84  Pulse: 79 93  Resp: 18 18  Temp:  36.7 C    Last Pain:  Vitals:   08/05/16 0845  TempSrc:   PainSc: 2    Pain Goal: Patients Stated Pain Goal: 3 (08/05/16 0845)               Rica RecordsICKELTON,Chontel Warning

## 2016-08-06 MED ORDER — IBUPROFEN 800 MG PO TABS: 800.0000 mg | ORAL_TABLET | Freq: Three times a day (TID) | ORAL | 0 refills | Status: DC | PRN

## 2016-08-06 NOTE — Lactation Note (Signed)
This note was copied from a baby's chart. Lactation Consultation Note  Patient Name: Tina Mueller QIONG'EToday's Date: 08/06/2016   Discharge day, baby 3438 hrs old.  Mom states baby fed at the breast 10 times in last 24 hrs, output adequate, weight loss at 5%.  Mom with lots of questions, but declined latch assist, saying she is latching better.  Reviewed basics, and engorgement prevention and treatment discussed.  Encouraged STS and feeding often on cue.  Goal of 8-12 feedings per 24 hrs.  Ped recommended she come back for OP lactation appointment, referral obtained.   OP appointment 08/09/16 @ 1pm.    Mom aware of OP lactation services and encouraged to call prn.      [name], [name] 08/06/2016, 10:16 AM

## 2016-08-06 NOTE — Discharge Summary (Signed)
Obstetric Discharge Summary Reason for Admission: induction of labor Prenatal Procedures: none Intrapartum Procedures: spontaneous vaginal delivery Postpartum Procedures: none Complications-Operative and Postpartum: none Hemoglobin  Date Value Ref Range Status  08/05/2016 11.7 (L) 12.0 - 15.0 g/dL Final   HCT  Date Value Ref Range Status  08/05/2016 34.4 (L) 36.0 - 46.0 % Final    Physical Exam:  General: alert Lochia: appropriate Uterine Fundus: firm Incision: healing well DVT Evaluation: No evidence of DVT seen on physical exam.  Discharge Diagnoses: Term Pregnancy-delivered  Discharge Information: Date: 08/06/2016 Activity: pelvic rest Diet: routine Medications: PNV and Ibuprofen Condition: stable Instructions: refer to practice specific booklet Discharge to: home Follow-up Information    [facility]. Schedule an appointment as soon as possible for a visit in 6 week(s).   Contact information: [address] [place] [id number] [phone]           Newborn Data: Live born female  Birth Weight: 8 lb 9.6 oz (3901 g) APGAR: 7, 8  Home with mother.  Cyleigh Massaro M 08/06/2016, 8:14 AM

## 2016-08-09 ENCOUNTER — Ambulatory Visit: Payer: Self-pay

## 2016-08-09 NOTE — Lactation Note (Signed)
This note was copied from a baby's chart. Lactation Consult  Mother's reason for visit: consultation Visit Type:  Feeding assessment Appointment Notes:  Difficulty breastfeeding 9 years ago Consult:  Initial Lactation Consultant:  Huston FoleyMOULDEN, Yamil Dougher S  ________________________________________________________________________  Baby's Name:  Tina Mueller Date of Birth:  08/04/2015 Pediatrician:  [facility] Gender:  female Gestational Age: 7548w5d (At Birth) Birth Weight:  8 lb 9.6 oz (3901 g) Weight at Discharge:    8-2                      Date of Discharge:  08/06/16 There were no vitals filed for this visit. [facility]: 8-3 ON 08/08/16      Location:Pediatrician's office Weight today8-5.1 ________________________________________________________________________  Mother's Name: Rowan BlaseElizabeth Merendino Type of delivery:  Vaginal, Spontaneous Delivery Breastfeeding Experience:  Difficult latch with first 9 years ago.  Pumped x 7 weeks  Maternal Medications:  Advil, tylenol, PNV's  ________________________________________________________________________  Breastfeeding History (Post Discharge)  Frequency of breastfeeding:  Attempts every 2-3 hours Duration of feeding:  5-20 minutes  Supplementation           Breastmilk:  Volume 60-490ml Frequency:  Every 3 hours   Method:  Bottle,   Pumping  Type of pump:  Medela pump in style Frequency:  Every 3 hours Volume:  90-16405ml  Infant Intake and Output Assessment  Voids: qs  in 24 hrs.  Color:  Clear yellow Stools: qs in 24 hrs.  Color:  Yellow  ________________________________________________________________________  Maternal Breast Assessment  Breast:  Full Nipple:  Erect Pain level:  0 Pain interventions:  Bra  _______________________________________________________________________ Feeding Assessment/Evaluation  Mom and 5 day old here for feeding assessment.  Mom states  milk came in yesterday but baby unable to latch or sustain latch.  Baby is currently crying and showing feeding cues.  Assisted mom with cross cradle hold using a U hold.  After a few attempts baby latched well and nursed actively.  When baby finished the first side I assisted mom with football hold on right.  Breast is too full to compress so a 24mm nipple shield used.  Baby latched easily and deep.  Baby transferred a total of 56 mls and came off breast relaxed and content.  Reviewed basics again and answered questions.  Lactation outpatient services and support group reviewed and encouraged.  Initial feeding assessment:  Infant's oral assessment:  Variance short lingual frenulum  Positioning:  PsychiatristCross cradle and Football Right breast 10 minutes in football hold using 24 mm nipple shield Left breast 10 minutes cross cradle  LATCH documentation:  Latch:  2 = Grasps breast easily, tongue down, lips flanged, rhythmical sucking.  Audible swallowing:  2 = Spontaneous and intermittent  Type of nipple:  2 = Everted at rest and after stimulation  Comfort (Breast/Nipple):  0 = Engorged, cracked, bleeding, large blisters, severe discomfort  Hold (Positioning):  1 = Assistance needed to correctly position infant at breast and maintain latch  LATCH score:  9  Attached assessment:  Deep  Lips flanged:  Yes.    Lips untucked:  No.  Suck assessment:  Nutritive  Tools:  Nipple shield 24 mm Instructed on use and cleaning of tool:  Yes.    Pre-feed weight:  3774 g   Post-feed weight:  3830 g Amount transferred:  56 ml Amount supplemented:  0 ml

## 2019-11-01 ENCOUNTER — Emergency Department (HOSPITAL_COMMUNITY): Payer: BC Managed Care – PPO

## 2019-11-01 ENCOUNTER — Emergency Department (HOSPITAL_COMMUNITY)
Admission: EM | Admit: 2019-11-01 | Discharge: 2019-11-02 | Disposition: A | Discharge status: Home / Self Care | Payer: BC Managed Care – PPO | Attending: Emergency Medicine | Admitting: Emergency Medicine

## 2019-11-01 ENCOUNTER — Encounter (HOSPITAL_COMMUNITY): Payer: Self-pay | Admitting: *Deleted

## 2019-11-01 ENCOUNTER — Other Ambulatory Visit: Payer: Self-pay

## 2019-11-01 DIAGNOSIS — Y999 Unspecified external cause status: Secondary | ICD-10-CM | POA: Insufficient documentation

## 2019-11-01 DIAGNOSIS — S53402A Unspecified sprain of left elbow, initial encounter: Secondary | ICD-10-CM | POA: Diagnosis not present

## 2019-11-01 DIAGNOSIS — S59902A Unspecified injury of left elbow, initial encounter: Secondary | ICD-10-CM | POA: Diagnosis present

## 2019-11-01 DIAGNOSIS — Y929 Unspecified place or not applicable: Secondary | ICD-10-CM | POA: Diagnosis not present

## 2019-11-01 DIAGNOSIS — Y9351 Activity, roller skating (inline) and skateboarding: Secondary | ICD-10-CM | POA: Diagnosis not present

## 2019-11-01 DIAGNOSIS — Z79899 Other long term (current) drug therapy: Secondary | ICD-10-CM | POA: Insufficient documentation

## 2019-11-01 DIAGNOSIS — S46912A Strain of unspecified muscle, fascia and tendon at shoulder and upper arm level, left arm, initial encounter: Secondary | ICD-10-CM

## 2019-11-01 MED ORDER — OXYCODONE-ACETAMINOPHEN 5-325 MG PO TABS
1.0000 | ORAL_TABLET | Freq: Once | ORAL | Status: AC
  Administered 2019-11-01: 1 via ORAL
  Filled 2019-11-01: qty 1

## 2019-11-01 MED ORDER — HYDROCODONE-ACETAMINOPHEN 5-325 MG PO TABS: 1.0000 | ORAL_TABLET | Freq: Four times a day (QID) | ORAL | 0 refills | Status: AC | PRN

## 2019-11-01 NOTE — ED Triage Notes (Signed)
THE PT IS C/O HER LT ELBOW PAIN  SHE FELL ROLLER SKATING  1600 TODAY SHE WAS SEEN AT [facility] AND SENT HERE FOR TREATMENT  NO PAION MED GIVEN THERE  LMP IUD

## 2019-11-01 NOTE — Discharge Instructions (Signed)
Recommend taking Tylenol Motrin as needed for pain control.  For breakthrough pain, recommend taking the prescribed Percocets.  Note these can make you drowsy and should not be taken while driving or operating heavy machinery.  The CT scan did not show any fracture of your elbow, recommend using the sling as needed for support, early range of motion and mobility is encouraged.  Recommend follow-up with orthopedics as discussed.  Return to ER if you develop severe pain, numbness, weakness in your arm.

## 2019-11-02 NOTE — ED Notes (Signed)
Patient verbalizes understanding of discharge instructions. Opportunity for questioning and answers were provided. Armband removed by staff, pt discharged from ED. Pt. ambulatory and discharged home.  

## 2019-11-03 NOTE — ED Provider Notes (Signed)
[facility] EMERGENCY DEPARTMENT Provider Note   CSN: [id number] Arrival date & time: 11/01/19  1918     History Chief Complaint  Patient presents with  . Joint Swelling    Tina Mueller is a 39 y.o. female presents to ER with concern for left elbow pain.  Patient states that she was rollerskating with her kids when she fell and landed on her left elbow.  Has been having severe pain ever since.  Pain improved with rest, worse with movement, sharp, stabbing, seems to also have some mild pain in her left wrist.  Prior to coming to emergency room, states that she went to her doctor's office and was diagnosed with a possible radial head fracture of her left elbow.  Wants further evaluation, treatment recs in our ER.  Denies any chronic medical conditions, no allergies to medications.  Denies any other injuries, no head trauma.  HPI     Past Medical History:  Diagnosis Date  . Eating disorder   . Frequent headaches   . Kyphosis   . Seasonal allergies   . UTI (urinary tract infection)     Patient Active Problem List   Diagnosis Date Noted  . Pregnancy 08/04/2016    Past Surgical History:  Procedure Laterality Date  . none       OB History    Gravida  3   Para  2   Term  2   Preterm      AB  1   Living  2     SAB  1   TAB      Ectopic      Multiple  0   Live Births  2           Family History  Problem Relation Age of Onset  . Arthritis Maternal Grandmother   . Breast cancer Maternal Grandmother   . Hypertension Maternal Grandmother   . Lung cancer Maternal Grandfather   . Heart disease Other        Possibly grandparents  . Hypertension Paternal Grandfather   . Leukemia Paternal Grandfather   . Hypertension Paternal Grandmother   . Skin cancer Mother   . Osteoporosis Other        Both sides of family.     Social History   Tobacco Use  . Smoking status: Never Smoker  . Smokeless tobacco: Never Used  Substance Use  Topics  . Alcohol use: Yes    Alcohol/week: 0.0 standard drinks    Comment: socially  . Drug use: No    Home Medications Prior to Admission medications   Medication Sig Start Date End Date Taking? Authorizing Provider  levonorgestrel (MIRENA) 20 MCG/24HR IUD 1 each by Intrauterine route once.   Yes [provider]  Multiple Vitamin (MULTIVITAMIN ADULT PO) Take 1 tablet by mouth daily.   Yes [provider]  HYDROcodone-acetaminophen (NORCO/VICODIN) 5-325 MG tablet Take 1 tablet by mouth every 6 (six) hours as needed for up to 3 days for moderate pain. 11/01/19 11/04/19  Milagros Loll, MD  ibuprofen (ADVIL,MOTRIN) 800 MG tablet Take 1 tablet (800 mg total) by mouth every 8 (eight) hours as needed for moderate pain. Patient not taking: Reported on 11/01/2019 08/06/16   Richarda Overlie, MD    Allergies    Patient has no known allergies.  Review of Systems   Review of Systems  Constitutional: Negative for chills and fever.  HENT: Negative for ear pain and sore throat.  Eyes: Negative for pain and visual disturbance.  Respiratory: Negative for cough and shortness of breath.   Cardiovascular: Negative for chest pain and palpitations.  Gastrointestinal: Negative for abdominal pain and vomiting.  Genitourinary: Negative for dysuria and hematuria.  Musculoskeletal: Positive for arthralgias. Negative for back pain.  Skin: Negative for color change and rash.  Neurological: Negative for seizures and syncope.  All other systems reviewed and are negative.   Physical Exam Updated Vital Signs BP (!) 138/104 (BP Location: Right Arm)   Pulse 80   Temp 98.7 F (37.1 C) (Oral)   Resp 16   Ht 5\' 11"  (1.803 m)   Wt 100.2 kg   LMP 11/01/2019   SpO2 100%   BMI 30.82 kg/m   Physical Exam Vitals and nursing note reviewed.  Constitutional:      Appearance: Normal appearance.  HENT:     Head: Normocephalic and atraumatic.  Eyes:     Pupils: Pupils are equal, round,  and reactive to light.  Cardiovascular:     Rate and Rhythm: Normal rate and regular rhythm.     Pulses: Normal pulses.  Pulmonary:     Effort: Pulmonary effort is normal. No respiratory distress.  Musculoskeletal:     Cervical back: Normal range of motion and neck supple.     Comments: LUE: Mild swelling, tenderness noted over left elbow, range of motion decreased due to pain, radial pulse intact, sensation intact, distal motor intact  Neurological:     Mental Status: She is alert.     ED Results / Procedures / Treatments   Labs (all labs ordered are listed, but only abnormal results are displayed) Labs Reviewed - No data to display  EKG None  Radiology DG Elbow Complete Left  Result Date: 11/01/2019 CLINICAL DATA:  Left elbow and wrist pain EXAM: LEFT ELBOW - COMPLETE 3+ VIEW COMPARISON:  None. FINDINGS: There is no evidence of fracture, dislocation, or joint effusion. There is no evidence of arthropathy or other focal bone abnormality. Soft tissues are unremarkable. IMPRESSION: Negative. Electronically Signed   By: 11/03/2019 M.D.   On: 11/01/2019 21:47   DG Wrist Complete Left  Result Date: 11/01/2019 CLINICAL DATA:  Left elbow and wrist pain EXAM: LEFT WRIST - COMPLETE 3+ VIEW COMPARISON:  None. FINDINGS: There is no evidence of fracture or dislocation. There is no evidence of arthropathy or other focal bone abnormality. Mild dorsal soft tissue swelling. IMPRESSION: No acute osseous abnormality. Electronically Signed   By: 11/03/2019 M.D.   On: 11/01/2019 21:52   CT Elbow Left Wo Contrast  Result Date: 11/01/2019 CLINICAL DATA:  Left elbow pain after fall roller skating today. Negative x-ray. Elbow pain, stress fracture suspected, neg xray left elbow pain EXAM: CT OF THE UPPER LEFT EXTREMITY WITHOUT CONTRAST TECHNIQUE: Multidetector CT imaging of the upper left extremity was performed according to the standard protocol. COMPARISON:  Radiograph earlier today. FINDINGS:  Bones/Joint/Cartilage No acute fracture. Normal alignment. No elbow joint effusion. Ligaments Suboptimally assessed by CT. Muscles and Tendons No evidence of intramuscular hematoma. Soft tissues Soft tissue edema posteriorly. No confluent hematoma. IMPRESSION: 1. No fracture or dislocation of the left elbow. If there is persistent clinical concern for occult fracture, consider MRI. 2. Soft tissue edema posteriorly. Electronically Signed   By: 11/03/2019 M.D.   On: 11/01/2019 23:21    Procedures Procedures (including critical care time)  Medications Ordered in ED Medications  oxyCODONE-acetaminophen (PERCOCET/ROXICET) 5-325 MG per tablet 1 tablet (1  tablet Oral Given 11/01/19 2012)    ED Course  I have reviewed the triage vital signs and the nursing notes.  Pertinent labs & imaging results that were available during my care of the patient were reviewed by me and considered in my medical decision making (see chart for details).    MDM Rules/Calculators/A&P                      39 year old lady presenting to ER with left elbow pain after rollerskating accident.  On exam patient well-appearing, did have some tenderness and mild swelling over her left elbow.  She reported having outpatient plain films that demonstrated radial head fracture (possible).  Repeated x-rays here, x-rays negative.  I reviewed with the reading radiologist who thought it was less likely to have occult radial head fracture, given discrepancy in the x-ray reports, recommended either repeat x-ray in a week or so for CT now.  Discussed these imaging options with patient, patient strongly desired to receive more definitive answer this evening and subsequently proceeded with CT imaging.  CT was negative for fracture. Suspect more likely muscle strain. Patient has sling, recommended early range of motion, follow-up with Ortho this week.  Provided short rx for percocet.     After the discussed management above, the patient  was determined to be safe for discharge.  The patient was in agreement with this plan and all questions regarding their care were answered.  ED return precautions were discussed and the patient will return to the ED with any significant worsening of condition.  Final Clinical Impression(s) / ED Diagnoses Final diagnoses:  Elbow strain, left, initial encounter    Rx / DC Orders ED Discharge Orders         Ordered    HYDROcodone-acetaminophen (NORCO/VICODIN) 5-325 MG tablet  Every 6 hours PRN     11/01/19 2334           Lucrezia Starch, MD 11/03/19 1310

## 2021-11-26 ENCOUNTER — Emergency Department (HOSPITAL_BASED_OUTPATIENT_CLINIC_OR_DEPARTMENT_OTHER): Payer: BC Managed Care – PPO | Admitting: Radiology

## 2021-11-26 ENCOUNTER — Emergency Department (HOSPITAL_BASED_OUTPATIENT_CLINIC_OR_DEPARTMENT_OTHER)
Admission: EM | Admit: 2021-11-26 | Discharge: 2021-11-26 | Disposition: A | Discharge status: Home / Self Care | Payer: BC Managed Care – PPO | Source: Home / Self Care | Attending: Emergency Medicine | Admitting: Emergency Medicine

## 2021-11-26 ENCOUNTER — Encounter (HOSPITAL_BASED_OUTPATIENT_CLINIC_OR_DEPARTMENT_OTHER): Payer: Self-pay

## 2021-11-26 ENCOUNTER — Other Ambulatory Visit: Payer: Self-pay

## 2021-11-26 DIAGNOSIS — K8012 Calculus of gallbladder with acute and chronic cholecystitis without obstruction: Secondary | ICD-10-CM | POA: Diagnosis not present

## 2021-11-26 DIAGNOSIS — M546 Pain in thoracic spine: Secondary | ICD-10-CM | POA: Insufficient documentation

## 2021-11-26 DIAGNOSIS — K810 Acute cholecystitis: Secondary | ICD-10-CM | POA: Diagnosis not present

## 2021-11-26 LAB — URINALYSIS, ROUTINE W REFLEX MICROSCOPIC
Glucose, UA: NEGATIVE mg/dL
Hgb urine dipstick: NEGATIVE
Ketones, ur: NEGATIVE mg/dL
Leukocytes,Ua: NEGATIVE
Nitrite: NEGATIVE
Protein, ur: 100 mg/dL — AB
Specific Gravity, Urine: 1.029 (ref 1.005–1.030)
Trans Epithel, UA: <1
pH: 8.5 — ABNORMAL HIGH (ref 5.0–8.0)

## 2021-11-26 LAB — PREGNANCY, URINE: Preg Test, Ur: NEGATIVE

## 2021-11-26 MED ORDER — HYDROCODONE-ACETAMINOPHEN 5-325 MG PO TABS: 1.0000 | ORAL_TABLET | ORAL | 0 refills | Status: DC | PRN

## 2021-11-26 MED ORDER — HYDROMORPHONE HCL 1 MG/ML IJ SOLN
2.0000 mg | Freq: Once | INTRAMUSCULAR | Status: AC
  Administered 2021-11-26: 2 mg via INTRAMUSCULAR
  Filled 2021-11-26: qty 2

## 2021-11-26 MED ORDER — ONDANSETRON 4 MG PO TBDP
8.0000 mg | ORAL_TABLET | Freq: Once | ORAL | Status: AC
  Administered 2021-11-26: 8 mg via ORAL
  Filled 2021-11-26: qty 2

## 2021-11-26 NOTE — ED Triage Notes (Signed)
Hx Kyphosis.   Pt sts over last month back pain has been worsening. When pain intensifies it causes nausea, vomiting and diarrhea.   Has been standing a lot recently with poor posture. Took what she suspects Is a gabapentin from friend with no improvement.

## 2021-11-26 NOTE — ED Notes (Signed)
Patient transported to X-ray 

## 2021-11-26 NOTE — ED Notes (Signed)
Pt picked up by significant other.
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
## 2021-11-28 ENCOUNTER — Other Ambulatory Visit: Payer: Self-pay

## 2021-11-28 ENCOUNTER — Encounter (HOSPITAL_BASED_OUTPATIENT_CLINIC_OR_DEPARTMENT_OTHER): Payer: Self-pay | Admitting: Emergency Medicine

## 2021-11-28 ENCOUNTER — Inpatient Hospital Stay (HOSPITAL_BASED_OUTPATIENT_CLINIC_OR_DEPARTMENT_OTHER)
Admission: EM | Admit: 2021-11-28 | Discharge: 2021-12-01 | DRG: 419 | Disposition: A | Discharge status: Home / Self Care | Payer: BC Managed Care – PPO | Attending: Internal Medicine | Admitting: Internal Medicine

## 2021-11-28 ENCOUNTER — Emergency Department (HOSPITAL_BASED_OUTPATIENT_CLINIC_OR_DEPARTMENT_OTHER): Payer: BC Managed Care – PPO

## 2021-11-28 DIAGNOSIS — K8050 Calculus of bile duct without cholangitis or cholecystitis without obstruction: Secondary | ICD-10-CM | POA: Diagnosis present

## 2021-11-28 DIAGNOSIS — Z8659 Personal history of other mental and behavioral disorders: Secondary | ICD-10-CM

## 2021-11-28 DIAGNOSIS — Z975 Presence of (intrauterine) contraceptive device: Secondary | ICD-10-CM | POA: Diagnosis not present

## 2021-11-28 DIAGNOSIS — G8929 Other chronic pain: Secondary | ICD-10-CM

## 2021-11-28 DIAGNOSIS — R7401 Elevation of levels of liver transaminase levels: Secondary | ICD-10-CM | POA: Diagnosis present

## 2021-11-28 DIAGNOSIS — K810 Acute cholecystitis: Secondary | ICD-10-CM | POA: Diagnosis present

## 2021-11-28 DIAGNOSIS — Z803 Family history of malignant neoplasm of breast: Secondary | ICD-10-CM

## 2021-11-28 DIAGNOSIS — E669 Obesity, unspecified: Secondary | ICD-10-CM | POA: Diagnosis present

## 2021-11-28 DIAGNOSIS — Z806 Family history of leukemia: Secondary | ICD-10-CM | POA: Diagnosis not present

## 2021-11-28 DIAGNOSIS — M40204 Unspecified kyphosis, thoracic region: Secondary | ICD-10-CM | POA: Diagnosis present

## 2021-11-28 DIAGNOSIS — Z8261 Family history of arthritis: Secondary | ICD-10-CM | POA: Diagnosis not present

## 2021-11-28 DIAGNOSIS — M546 Pain in thoracic spine: Secondary | ICD-10-CM | POA: Diagnosis present

## 2021-11-28 DIAGNOSIS — K8012 Calculus of gallbladder with acute and chronic cholecystitis without obstruction: Secondary | ICD-10-CM | POA: Diagnosis present

## 2021-11-28 DIAGNOSIS — Z8249 Family history of ischemic heart disease and other diseases of the circulatory system: Secondary | ICD-10-CM

## 2021-11-28 DIAGNOSIS — Z8262 Family history of osteoporosis: Secondary | ICD-10-CM | POA: Diagnosis not present

## 2021-11-28 DIAGNOSIS — K8063 Calculus of gallbladder and bile duct with acute cholecystitis with obstruction: Secondary | ICD-10-CM | POA: Diagnosis not present

## 2021-11-28 DIAGNOSIS — Z801 Family history of malignant neoplasm of trachea, bronchus and lung: Secondary | ICD-10-CM

## 2021-11-28 DIAGNOSIS — R7989 Other specified abnormal findings of blood chemistry: Secondary | ICD-10-CM

## 2021-11-28 DIAGNOSIS — Z79899 Other long term (current) drug therapy: Secondary | ICD-10-CM

## 2021-11-28 DIAGNOSIS — K8020 Calculus of gallbladder without cholecystitis without obstruction: Secondary | ICD-10-CM

## 2021-11-28 DIAGNOSIS — Z6831 Body mass index (BMI) 31.0-31.9, adult: Secondary | ICD-10-CM

## 2021-11-28 DIAGNOSIS — Z808 Family history of malignant neoplasm of other organs or systems: Secondary | ICD-10-CM | POA: Diagnosis not present

## 2021-11-28 LAB — CBC WITH DIFFERENTIAL/PLATELET
Abs Immature Granulocytes: 0.01 10*3/uL (ref 0.00–0.07)
Basophils Absolute: 0.1 10*3/uL (ref 0.0–0.1)
Basophils Relative: 1 %
Eosinophils Absolute: 0.3 10*3/uL (ref 0.0–0.5)
Eosinophils Relative: 5 %
HCT: 42.9 % (ref 36.0–46.0)
Hemoglobin: 13.9 g/dL (ref 12.0–15.0)
Immature Granulocytes: 0 %
Lymphocytes Relative: 23 %
Lymphs Abs: 1.1 10*3/uL (ref 0.7–4.0)
MCH: 30.4 pg (ref 26.0–34.0)
MCHC: 32.4 g/dL (ref 30.0–36.0)
MCV: 93.9 fL (ref 80.0–100.0)
Monocytes Absolute: 0.4 10*3/uL (ref 0.1–1.0)
Monocytes Relative: 7 %
Neutro Abs: 3.1 10*3/uL (ref 1.7–7.7)
Neutrophils Relative %: 64 %
Platelets: 208 10*3/uL (ref 150–400)
RBC: 4.57 MIL/uL (ref 3.87–5.11)
RDW: 12.8 % (ref 11.5–15.5)
WBC: 4.9 10*3/uL (ref 4.0–10.5)
nRBC: 0 % (ref 0.0–0.2)

## 2021-11-28 LAB — URINALYSIS, ROUTINE W REFLEX MICROSCOPIC
Glucose, UA: NEGATIVE mg/dL
Hgb urine dipstick: NEGATIVE
Ketones, ur: NEGATIVE mg/dL
Nitrite: NEGATIVE
Protein, ur: NEGATIVE mg/dL
Specific Gravity, Urine: 1.01 (ref 1.005–1.030)
pH: 6.5 (ref 5.0–8.0)

## 2021-11-28 LAB — LIPASE, BLOOD: Lipase: 18 U/L (ref 11–51)

## 2021-11-28 LAB — COMPREHENSIVE METABOLIC PANEL
ALT: 1005 U/L — ABNORMAL HIGH (ref 0–44)
AST: 408 U/L — ABNORMAL HIGH (ref 15–41)
Albumin: 4.6 g/dL (ref 3.5–5.0)
Alkaline Phosphatase: 173 U/L — ABNORMAL HIGH (ref 38–126)
Anion gap: 13 (ref 5–15)
BUN: 9 mg/dL (ref 6–20)
CO2: 23 mmol/L (ref 22–32)
Calcium: 9.6 mg/dL (ref 8.9–10.3)
Chloride: 103 mmol/L (ref 98–111)
Creatinine, Ser: 0.56 mg/dL (ref 0.44–1.00)
GFR, Estimated: >60 mL/min (ref >60)
Glucose, Bld: 87 mg/dL (ref 70–99)
Potassium: 3.5 mmol/L (ref 3.5–5.1)
Sodium: 139 mmol/L (ref 135–145)
Total Bilirubin: 2.6 mg/dL — ABNORMAL HIGH (ref 0.3–1.2)
Total Protein: 8 g/dL (ref 6.5–8.1)

## 2021-11-28 LAB — PREGNANCY, URINE: Preg Test, Ur: NEGATIVE

## 2021-11-28 LAB — HEPATITIS PANEL, ACUTE
HCV Ab: NONREACTIVE
Hep A IgM: NONREACTIVE
Hep B C IgM: NONREACTIVE
Hepatitis B Surface Ag: NONREACTIVE

## 2021-11-28 LAB — BILIRUBIN, DIRECT: Bilirubin, Direct: 1.4 mg/dL — ABNORMAL HIGH (ref 0.0–0.2)

## 2021-11-28 MED ORDER — ONDANSETRON HCL 4 MG/2ML IJ SOLN
4.0000 mg | Freq: Once | INTRAMUSCULAR | Status: AC
  Administered 2021-11-28: 4 mg via INTRAVENOUS
  Filled 2021-11-28: qty 2

## 2021-11-28 MED ORDER — FENTANYL CITRATE PF 50 MCG/ML IJ SOSY
50.0000 ug | PREFILLED_SYRINGE | Freq: Once | INTRAMUSCULAR | Status: AC
  Administered 2021-11-28: 50 ug via INTRAVENOUS
  Filled 2021-11-28: qty 1

## 2021-11-28 MED ORDER — HYDROMORPHONE HCL 1 MG/ML IJ SOLN
0.5000 mg | Freq: Once | INTRAMUSCULAR | Status: AC
  Administered 2021-11-28: 0.5 mg via INTRAVENOUS
  Filled 2021-11-28: qty 0.5

## 2021-11-28 MED ORDER — PIPERACILLIN-TAZOBACTAM 3.375 G IVPB 30 MIN
3.3750 g | Freq: Three times a day (TID) | INTRAVENOUS | Status: DC
Start: 2021-11-28 — End: 2021-11-28

## 2021-11-28 MED ORDER — PIPERACILLIN-TAZOBACTAM 3.375 G IVPB
3.3750 g | Freq: Three times a day (TID) | INTRAVENOUS | Status: DC
Start: 2021-11-28 — End: 2021-11-30
  Administered 2021-11-28 – 2021-11-30 (×5): 3.375 g via INTRAVENOUS
  Filled 2021-11-28 (×5): qty 50

## 2021-11-28 MED ORDER — LACTATED RINGERS IV SOLN: INTRAVENOUS | Status: AC

## 2021-11-28 MED ORDER — ONDANSETRON HCL 4 MG/2ML IJ SOLN: 4.0000 mg | Freq: Four times a day (QID) | INTRAMUSCULAR | Status: DC | PRN

## 2021-11-28 MED ORDER — MELATONIN 5 MG PO TABS: 10.0000 mg | ORAL_TABLET | Freq: Every evening | ORAL | Status: DC | PRN

## 2021-11-28 MED ORDER — HYDROMORPHONE HCL 1 MG/ML IJ SOLN: 0.5000 mg | INTRAMUSCULAR | Status: DC | PRN

## 2021-11-28 MED ORDER — ONDANSETRON HCL 4 MG PO TABS: 4.0000 mg | ORAL_TABLET | Freq: Four times a day (QID) | ORAL | Status: DC | PRN

## 2021-11-28 MED ORDER — OXYCODONE HCL 5 MG PO TABS: 5.0000 mg | ORAL_TABLET | ORAL | Status: DC | PRN

## 2021-11-28 MED ORDER — LACTATED RINGERS IV BOLUS
1000.0000 mL | Freq: Once | INTRAVENOUS | Status: AC
  Administered 2021-11-28: 1000 mL via INTRAVENOUS

## 2021-11-28 NOTE — ED Notes (Signed)
Handoff report given to Tammy Sours RN on 6E at [facility].  [facility] at bedside

## 2021-11-28 NOTE — Assessment & Plan Note (Addendum)
Admit to inpatient. [facility] consulted by EDP. Start IVF with LR @ 75 ml/hr. NPO. Prn oxycodone 5 mg moderate pain, prn 0.5 mg IV dilaudid severe pain. IV Zosyn for acute cholecystits. Pt will need ERCP prior to cholecystectomy. EDP has also consulted general surgery.  Choledocholithiasis is a Acute illness/condition that poses a threat to life or bodily function.

## 2021-11-28 NOTE — ED Triage Notes (Addendum)
Pt arrives to ED with c/o continued back pain. She reports over the weekend she experienced yellow color change to bilateral sclera and orange colored urine. She also reports new onset polydipsia. Was seen for same on 6/10.

## 2021-11-28 NOTE — ED Provider Notes (Signed)
[facility] EMERGENCY DEPT Provider Note   CSN: [id number] Arrival date & time: 11/28/21  0900     History  Chief Complaint  Patient presents with   Back Pain    Tina Mueller is a 41 y.o. female.   Back Pain Associated symptoms: abdominal pain     41 year old female presenting to the emergency department with ongoing back pain.  The patient states that she has had several weeks of intermittent right back discomfort.  She denies any recent falls or trauma.  On Friday, she had worsening of her pain with associated nausea and vomiting and presented to the emergency department where she was evaluated and subsequently discharged.  Since then, she has had persistent back pain but now endorses right upper quadrant pain, a yellow change to her sclera bilaterally with associated orange-colored urine.  She has been having significant nausea and multiple episodes of retching.  She denies any fevers or chills.  Home Medications Prior to Admission medications   Medication Sig Start Date End Date Taking? Authorizing Provider  HYDROcodone-acetaminophen (NORCO) 5-325 MG tablet Take 1 tablet by mouth every 4 (four) hours as needed for severe pain. 11/26/21   Molpus, John, MD  levonorgestrel (MIRENA) 20 MCG/24HR IUD 1 each by Intrauterine route once.    [provider]  Multiple Vitamin (MULTIVITAMIN ADULT PO) Take 1 tablet by mouth daily.    [provider]      Allergies    Patient has no known allergies.    Review of Systems   Review of Systems  Gastrointestinal:  Positive for abdominal pain, nausea and vomiting.  Musculoskeletal:  Positive for back pain.  All other systems reviewed and are negative.   Physical Exam Updated Vital Signs BP 123/85 (BP Location: Right Arm)   Pulse 70   Temp 98.2 F (36.8 C) (Oral)   Resp 18   SpO2 98%  Physical Exam Vitals and nursing note reviewed.  Constitutional:      General: She is not in acute distress.     Appearance: She is well-developed.  HENT:     Head: Normocephalic and atraumatic.  Eyes:     Conjunctiva/sclera: Conjunctivae normal.  Cardiovascular:     Rate and Rhythm: Normal rate and regular rhythm.     Heart sounds: No murmur heard. Pulmonary:     Effort: Pulmonary effort is normal. No respiratory distress.     Breath sounds: Normal breath sounds.  Abdominal:     Palpations: Abdomen is soft.     Tenderness: There is abdominal tenderness in the right upper quadrant and right lower quadrant. There is guarding. Positive signs include Murphy's sign.  Musculoskeletal:        General: No swelling.     Cervical back: Neck supple.  Skin:    General: Skin is warm and dry.     Capillary Refill: Capillary refill takes less than 2 seconds.  Neurological:     Mental Status: She is alert.  Psychiatric:        Mood and Affect: Mood normal.     ED Results / Procedures / Treatments   Labs (all labs ordered are listed, but only abnormal results are displayed) Labs Reviewed  COMPREHENSIVE METABOLIC PANEL - Abnormal; Notable for the following components:      Result Value   AST 408 (*)    ALT 1,005 (*)    Alkaline Phosphatase 173 (*)    Total Bilirubin 2.6 (*)    All other components within  normal limits  URINALYSIS, ROUTINE W REFLEX MICROSCOPIC - Abnormal; Notable for the following components:   Bilirubin Urine SMALL (*)    Leukocytes,Ua TRACE (*)    All other components within normal limits  CBC WITH DIFFERENTIAL/PLATELET  PREGNANCY, URINE  LIPASE, BLOOD  HEPATITIS PANEL, ACUTE  BILIRUBIN, DIRECT    EKG None  Radiology US Abdomen Limited RUQ (LIVER/GB)  Result Date: 11/28/2021 CLINICAL DATA:  Elevated LFTs EXAM: ULTRASOUND ABDOMEN LIMITED RIGHT UPPER QUADRANT COMPARISON:  None Available. FINDINGS: Gallbladder: Gallbladder wall thickening. Masslike thickening of the gallbladder fundus with associated calcifications. Positive Murphy sign noted by sonographer. Common bile  duct: Diameter: 11.3 mm A stone is seen in the common bile duct. Liver: No focal lesion identified. Within normal limits in parenchymal echogenicity. Portal vein is patent on color Doppler imaging with normal direction of blood flow towards the liver. Other: None. IMPRESSION: 1. Gallbladder wall thickening with positive sonographic Murphy sign, findings can be seen in the setting of acute cholecystitis. 2. Dilated common bile duct with common bile duct stone visualized, findings are compatible with choledocholithiasis. 3. Masslike thickening of the gallbladder fundus with associated calcifications, differential considerations include localized adenomyomatosis, non mobile tumefactive sludge, or gallbladder mass. MRI with contrast could be performed for further evaluation. Electronically Signed   By: Allegra LaiLeah  Strickland M.D.   On: 11/28/2021 11:20    Procedures Procedures    Medications Ordered in ED Medications  lactated ringers bolus 1,000 mL (0 mLs Intravenous Stopped 11/28/21 1202)  fentaNYL (SUBLIMAZE) injection 50 mcg (50 mcg Intravenous Given 11/28/21 1056)  ondansetron (ZOFRAN) injection 4 mg (4 mg Intravenous Given 11/28/21 1128)    ED Course/ Medical Decision Making/ A&P                           Medical Decision Making Amount and/or Complexity of Data Reviewed Labs: ordered. Radiology: ordered.  Risk Prescription drug management. Decision regarding hospitalization.   41 year old female presenting to the emergency department with ongoing back pain.  The patient states that she has had several weeks of intermittent right back discomfort.  She denies any recent falls or trauma.  On Friday, she had worsening of her pain with associated nausea and vomiting and presented to the emergency department where she was evaluated and subsequently discharged.  Since then, she has had persistent back pain but now endorses right upper quadrant pain, a yellow change to her sclera bilaterally with  associated orange-colored urine.  She has been having significant nausea and multiple episodes of retching.  She denies any fevers or chills.  On arrival, the patient was vitally stable.  NSR noted on cardiac telemetry.  Physical exam significant for right upper quadrant tenderness to palpation with guarding present, positive Murphy sign present.  Given the patient's several weeks of back pain, worsening symptoms, nausea and vomiting, concern for Coley lithiasis/cholecystitis/choledocholithiasis.  Additionally considered pancreatitis, small bowel obstruction, appendicitis, nephrolithiasis.    Initial labs and first look concerning for significantly elevated LFTs and elevated T. bili.  The patient's ALT was 1005, AST 408, alkaline phosphatase 173, T. bili elevated to 2.6, direct bili collected and pending, hepatitis panel collected and pending, lipase normal, urinalysis without clear evidence of UTI, small bilirubin present, CBC without a leukocytosis or anemia.  This is concerning for choledocholithiasis with biliary dysfunction.  Will obtain ultrasound of the right upper quadrant to further evaluate.  Right upper quadrant ultrasound evaluated by myself concerning for gallbladder wall thickening,  radiology interpretation concerning for gallbladder wall thickening with positive sonographic Murphy sign, findings can be seen in setting of acute cholecystitis.  Dilated CBD to 11.3 mm with a common bile duct stone visualized concerning with choledocholithiasis.  Masslike thickening of the gallbladder fundus with associated calcifications with differential considerations include localized adenomyomatosis, none mobile tumefactive sludge or gallbladder mass.  MRI could be performed for further evaluation.  I did speak with on-call general surgery who will declined surgical intervention at this time given the choledocholithiasis seen, did discuss the patient with on-call gastroenterology, Dr. Carole Binning who will see  the patient in consultation.  Dr. Ronaldo Miyamoto of hospitalist medicine was consulted for admission.  On repeat assessment, the patient's pain remained well controlled, nausea was well controlled, not septic appearing, stable for admission.   Final Clinical Impression(s) / ED Diagnoses Final diagnoses:  Acute cholecystitis  Choledocholithiasis    Rx / DC Orders ED Discharge Orders     None         Ernie Avena, MD 11/28/21 1507

## 2021-11-28 NOTE — Assessment & Plan Note (Signed)
New. Will need cholecystectomy

## 2021-11-28 NOTE — Subjective & Objective (Signed)
CC: back pain HPI: 41 year old white female with no significant medical history except for thoracic kyphosis, takes no prescription medications, presents to the ER today with a 4-week history of worsening back pain.  She has been noting increasing musculoskeletal pain that she thought was related to her thoracic kyphosis.  She has been going to the chiropractor, acupuncturist, physical therapist.  She is being getting these treatments really without any improvement in her pain.  She is noted increasing bloating and abdominal pain anytime she eats anything with grease or fat.  She presented to the ER 2 days ago with extreme back pain.  She was given some IV Dilaudid.  X-rays were negative.  No blood work was drawn at that time.  She presented again today to the ER due to worsening abdominal pain and back pain.  This time she is noted increasing scleral icterus.  Her urine is also orange in color.  Initial vitals: Temp 98.2 heart rate 78 blood pressure 114/81 satting 100% in  room air.  Laboratory evaluation:  Chemistries showed AST of 408, ALT of 1005, alk phos of 173, total bili 2.6  Urinalysis showed small bilirubin, trace leukocytes  Urine pregnancy test was negative.  Lipase normal at 18.  White count 4.9, hemoglobin of 13.9, platelets of 208  Right upper quadrant ultrasound demonstrated gallbladder wall thickening.  There is thickening of the gallbladder fundus with associated calcifications.  Positive sonographic Murphy sign.  Common bile duct was 11.3 mm.  Stone seen in the common bile duct.  EDP contacted general surgery who declined surgical intervention due to the patient's choledocholithiasis.  EDP is also contacted [facility] Dr. Roena Malady.  Patient transferred to [facility] under the hospitalist service.

## 2021-11-28 NOTE — H&P (Addendum)
History and Physical    Tina Mueller OBS:[id number] DOB: [birth year] DOA: 11/28/2021  DOS: the patient was seen and examined on 11/28/2021  PCP: Percell Belt, DO   Patient coming from: Home  I have personally briefly reviewed patient's old medical records in [place]  CC: back pain HPI: 41 year old white female with no significant medical history except for thoracic kyphosis, takes no prescription medications, presents to the ER today with a 4-week history of worsening back pain.  She has been noting increasing musculoskeletal pain that she thought was related to her thoracic kyphosis.  She has been going to the chiropractor, acupuncturist, physical therapist.  She is being getting these treatments really without any improvement in her pain.  She is noted increasing bloating and abdominal pain anytime she eats anything with grease or fat.  She presented to the ER 2 days ago with extreme back pain.  She was given some IV Dilaudid.  X-rays were negative.  No blood work was drawn at that time.  She presented again today to the ER due to worsening abdominal pain and back pain.  This time she is noted increasing scleral icterus.  Her urine is also orange in color.  Initial vitals: Temp 98.2 heart rate 78 blood pressure 114/81 satting 100% in  room air.  Laboratory evaluation:  Chemistries showed AST of 408, ALT of 1005, alk phos of 173, total bili 2.6  Urinalysis showed small bilirubin, trace leukocytes  Urine pregnancy test was negative.  Lipase normal at 18.  White count 4.9, hemoglobin of 13.9, platelets of 208  Right upper quadrant ultrasound demonstrated gallbladder wall thickening.  There is thickening of the gallbladder fundus with associated calcifications.  Positive sonographic Murphy sign.  Common bile duct was 11.3 mm.  Stone seen in the common bile duct.  EDP contacted general surgery who declined surgical intervention due to the patient's  choledocholithiasis.  EDP is also contacted [facility] Dr. Roena Malady.  Patient transferred to [facility] under the hospitalist service.   ED Course: Right upper quadrant ultrasound positive for choledocholithiasis, cholecystitis.  Review of Systems:  Review of Systems  Constitutional: Negative.   HENT: Negative.    Eyes:        Positive yellowing of the eyes  Respiratory: Negative.    Cardiovascular: Negative.   Gastrointestinal:  Positive for abdominal pain and nausea.  Musculoskeletal:  Positive for back pain.  Skin: Negative.   Neurological: Negative.   Endo/Heme/Allergies: Negative.   Psychiatric/Behavioral: Negative.    All other systems reviewed and are negative.   Past Medical History:  Diagnosis Date   Eating disorder    Frequent headaches    Kyphosis    Pregnancy 08/04/2016   Seasonal allergies    UTI (urinary tract infection)     Past Surgical History:  Procedure Laterality Date   none       reports that she has never smoked. She has never used smokeless tobacco. She reports current alcohol use. She reports that she does not use drugs.  No Known Allergies  Family History  Problem Relation Age of Onset   Arthritis Maternal Grandmother    Breast cancer Maternal Grandmother    Hypertension Maternal Grandmother    Lung cancer Maternal Grandfather    Heart disease Other        Possibly grandparents   Hypertension Paternal Grandfather    Leukemia Paternal Grandfather    Hypertension Paternal Grandmother    Skin cancer Mother  Osteoporosis Other        Both sides of family.     Prior to Admission medications   Medication Sig Start Date End Date Taking? Authorizing Provider  acetaminophen (TYLENOL) 500 MG tablet Take 500 mg by mouth every 6 (six) hours as needed for moderate pain.   Yes [provider]  HYDROcodone-acetaminophen (NORCO) 5-325 MG tablet Take 1 tablet by mouth every 4 (four) hours as needed for severe pain. 11/26/21  Yes  Molpus, John, MD  levonorgestrel (MIRENA) 20 MCG/24HR IUD 1 each by Intrauterine route once.   Yes [provider]  Multiple Vitamin (MULTIVITAMIN ADULT PO) Take 1 tablet by mouth daily.   Yes [provider]    Physical Exam: Vitals:   11/28/21 1756 11/28/21 1856 11/28/21 1902 11/28/21 1903  BP:  121/76    Pulse:  67    Resp:      Temp: 98.3 F (36.8 C) 98.3 F (36.8 C)    TempSrc: Oral Oral    SpO2:  95%    Weight:    102 kg  Height:   5' 11"  (1.803 m)     Physical Exam Vitals and nursing note reviewed.  Constitutional:      General: She is not in acute distress.    Appearance: Normal appearance. She is normal weight. She is not ill-appearing, toxic-appearing or diaphoretic.  HENT:     Head: Normocephalic and atraumatic.     Nose: Nose normal.  Eyes:     General: Scleral icterus present.  Cardiovascular:     Rate and Rhythm: Normal rate and regular rhythm.     Pulses: Normal pulses.  Pulmonary:     Effort: Pulmonary effort is normal. No respiratory distress.     Breath sounds: Normal breath sounds.  Abdominal:     General: Bowel sounds are normal.     Palpations: Abdomen is soft.     Tenderness: There is abdominal tenderness in the right upper quadrant. There is no guarding or rebound. Positive signs include Murphy's sign.  Musculoskeletal:     Right lower leg: No edema.     Left lower leg: No edema.  Skin:    General: Skin is warm and dry.     Capillary Refill: Capillary refill takes less than 2 seconds.  Neurological:     General: No focal deficit present.     Mental Status: She is alert and oriented to person, place, and time.      Labs on Admission: I have personally reviewed following labs and imaging studies  CBC: Recent Labs  Lab 11/28/21 0930  WBC 4.9  NEUTROABS 3.1  HGB 13.9  HCT 42.9  MCV 93.9  PLT 338   Basic Metabolic Panel: Recent Labs  Lab 11/28/21 0930  NA 139  K 3.5  CL 103  CO2 23  GLUCOSE 87  BUN 9   CREATININE 0.56  CALCIUM 9.6   GFR: Estimated Creatinine Clearance: 122.9 mL/min (by C-G formula based on SCr of 0.56 mg/dL). Liver Function Tests: Recent Labs  Lab 11/28/21 0930  AST 408*  ALT 1,005*  ALKPHOS 173*  BILITOT 2.6*  PROT 8.0  ALBUMIN 4.6   Recent Labs  Lab 11/28/21 0930  LIPASE 18   No results for input(s): "AMMONIA" in the last 168 hours. Coagulation Profile: No results for input(s): "INR", "PROTIME" in the last 168 hours. Cardiac Enzymes: No results for input(s): "CKTOTAL", "CKMB", "CKMBINDEX", "TROPONINI", "TROPONINIHS" in the last 168 hours. BNP (last 3  results) No results for input(s): "PROBNP" in the last 8760 hours. HbA1C: No results for input(s): "HGBA1C" in the last 72 hours. CBG: No results for input(s): "GLUCAP" in the last 168 hours. Lipid Profile: No results for input(s): "CHOL", "HDL", "LDLCALC", "TRIG", "CHOLHDL", "LDLDIRECT" in the last 72 hours. Thyroid Function Tests: No results for input(s): "TSH", "T4TOTAL", "FREET4", "T3FREE", "THYROIDAB" in the last 72 hours. Anemia Panel: No results for input(s): "VITAMINB12", "FOLATE", "FERRITIN", "TIBC", "IRON", "RETICCTPCT" in the last 72 hours. Urine analysis:    Component Value Date/Time   COLORURINE YELLOW 11/28/2021 0930   APPEARANCEUR CLEAR 11/28/2021 0930   LABSPEC 1.010 11/28/2021 0930   PHURINE 6.5 11/28/2021 0930   GLUCOSEU NEGATIVE 11/28/2021 0930   HGBUR NEGATIVE 11/28/2021 0930   BILIRUBINUR SMALL (A) 11/28/2021 0930   KETONESUR NEGATIVE 11/28/2021 0930   PROTEINUR NEGATIVE 11/28/2021 0930   NITRITE NEGATIVE 11/28/2021 0930   LEUKOCYTESUR TRACE (A) 11/28/2021 0930    Radiological Exams on Admission: I have personally reviewed images US Abdomen Limited RUQ (LIVER/GB)  Result Date: 11/28/2021 CLINICAL DATA:  Elevated LFTs EXAM: ULTRASOUND ABDOMEN LIMITED RIGHT UPPER QUADRANT COMPARISON:  None Available. FINDINGS: Gallbladder: Gallbladder wall thickening. Masslike thickening  of the gallbladder fundus with associated calcifications. Positive Murphy sign noted by sonographer. Common bile duct: Diameter: 11.3 mm A stone is seen in the common bile duct. Liver: No focal lesion identified. Within normal limits in parenchymal echogenicity. Portal vein is patent on color Doppler imaging with normal direction of blood flow towards the liver. Other: None. IMPRESSION: 1. Gallbladder wall thickening with positive sonographic Murphy sign, findings can be seen in the setting of acute cholecystitis. 2. Dilated common bile duct with common bile duct stone visualized, findings are compatible with choledocholithiasis. 3. Masslike thickening of the gallbladder fundus with associated calcifications, differential considerations include localized adenomyomatosis, non mobile tumefactive sludge, or gallbladder mass. MRI with contrast could be performed for further evaluation. Electronically Signed   By: Yetta Glassman M.D.   On: 11/28/2021 11:20      EKG: My personal interpretation of EKG shows: no EKG  Assessment/Plan Principal Problem:   Choledocholithiasis Active Problems:   Cholelithiasis   Elevated LFTs   Acute cholecystitis   Assessment and Plan: * Choledocholithiasis Admit to inpatient. [facility] consulted by EDP. Start IVF with LR @ 75 ml/hr. NPO. Prn oxycodone 5 mg moderate pain, prn 0.5 mg IV dilaudid severe pain. IV Zosyn for acute cholecystits. Pt will need ERCP prior to cholecystectomy. EDP has also consulted general surgery.  Choledocholithiasis is a Acute illness/condition that poses a threat to life or bodily function.   Cholelithiasis New. Will need cholecystectomy  Elevated LFTs New. Acute hepatitis screen negative. Due to cholelithiasis/choledocholithasis.  Repeat CMP in AM.  Acute cholecystitis New. Due to gallstones. Start IV zosyn 3.375 g IV q8h.   DVT prophylaxis: SCDs Code Status: Full Code Family Communication: discussed with pt and husband stephen at  bedside Disposition Plan: return home  Consults called: EDP has consulted GI and general surgery  Admission status: Inpatient, Med-Surg   Kristopher Oppenheim, DO [facility] 11/28/2021, 7:41 PM

## 2021-11-28 NOTE — Assessment & Plan Note (Signed)
New. Acute hepatitis screen negative. Due to cholelithiasis/choledocholithasis.  Repeat CMP in AM.

## 2021-11-28 NOTE — Assessment & Plan Note (Addendum)
New. Due to gallstones. Start IV zosyn 3.375 g IV q8h.

## 2021-11-28 NOTE — Progress Notes (Signed)
Plan of Care Note for accepted transfer   Patient: Tina Mueller MRN: [id number]   DOA: 11/28/2021  [facility]ility requesting transfer: [facility] Requesting Provider: Dr. Karene Fry Reason for transfer: Choledocholithiasis  Facility course: 41 yo F presenting with back pain and orange colored urine for the last week. Labs show elevated LFTs. Imaging shows choledocholithiasis and acute cholecystitis. Surgery paged, but deferred to GI. [facility] notified and will see at [facility].   Plan of care: The patient is accepted for admission to Telemetry unit, at [facility]. While holding at [facility], medical decision making responsibilities will remain w/ the EDP. Upon arrival to [facility], [facility] will assume care.   Author: Teddy Spike, DO 11/28/2021  Check [email or web address] for on-call coverage.  Nursing staff, Please call [facility] Admits & Consults System-Wide number on Amion as soon as patient's arrival, so appropriate admitting provider can evaluate the pt.

## 2021-11-29 ENCOUNTER — Inpatient Hospital Stay (HOSPITAL_COMMUNITY): Payer: BC Managed Care – PPO | Admitting: Anesthesiology

## 2021-11-29 ENCOUNTER — Encounter (HOSPITAL_COMMUNITY): Payer: Self-pay | Admitting: Internal Medicine

## 2021-11-29 ENCOUNTER — Encounter (HOSPITAL_COMMUNITY)
Admission: EM | Disposition: A | Discharge status: Home / Self Care | Payer: Self-pay | Source: Home / Self Care | Attending: Internal Medicine

## 2021-11-29 ENCOUNTER — Inpatient Hospital Stay (HOSPITAL_COMMUNITY): Payer: BC Managed Care – PPO

## 2021-11-29 DIAGNOSIS — G8929 Other chronic pain: Secondary | ICD-10-CM

## 2021-11-29 DIAGNOSIS — M546 Pain in thoracic spine: Secondary | ICD-10-CM

## 2021-11-29 DIAGNOSIS — K810 Acute cholecystitis: Secondary | ICD-10-CM | POA: Diagnosis not present

## 2021-11-29 DIAGNOSIS — R7989 Other specified abnormal findings of blood chemistry: Secondary | ICD-10-CM | POA: Diagnosis not present

## 2021-11-29 DIAGNOSIS — K8050 Calculus of bile duct without cholangitis or cholecystitis without obstruction: Secondary | ICD-10-CM | POA: Diagnosis not present

## 2021-11-29 HISTORY — PX: REMOVAL OF STONES: SHX5545

## 2021-11-29 HISTORY — PX: SPHINCTEROTOMY: SHX5544

## 2021-11-29 HISTORY — PX: ENDOSCOPIC RETROGRADE CHOLANGIOPANCREATOGRAPHY (ERCP) WITH PROPOFOL: SHX5810

## 2021-11-29 LAB — COMPREHENSIVE METABOLIC PANEL
ALT: 782 U/L — ABNORMAL HIGH (ref 0–44)
AST: 275 U/L — ABNORMAL HIGH (ref 15–41)
Albumin: 3.6 g/dL (ref 3.5–5.0)
Alkaline Phosphatase: 172 U/L — ABNORMAL HIGH (ref 38–126)
Anion gap: 6 (ref 5–15)
BUN: 12 mg/dL (ref 6–20)
CO2: 25 mmol/L (ref 22–32)
Calcium: 8.7 mg/dL — ABNORMAL LOW (ref 8.9–10.3)
Chloride: 110 mmol/L (ref 98–111)
Creatinine, Ser: 0.73 mg/dL (ref 0.44–1.00)
GFR, Estimated: >60 mL/min (ref >60)
Glucose, Bld: 87 mg/dL (ref 70–99)
Potassium: 3.9 mmol/L (ref 3.5–5.1)
Sodium: 141 mmol/L (ref 135–145)
Total Bilirubin: 2.4 mg/dL — ABNORMAL HIGH (ref 0.3–1.2)
Total Protein: 6.6 g/dL (ref 6.5–8.1)

## 2021-11-29 LAB — CBC WITH DIFFERENTIAL/PLATELET
Abs Immature Granulocytes: 0.01 10*3/uL (ref 0.00–0.07)
Basophils Absolute: 0 10*3/uL (ref 0.0–0.1)
Basophils Relative: 1 %
Eosinophils Absolute: 0.2 10*3/uL (ref 0.0–0.5)
Eosinophils Relative: 6 %
HCT: 40.1 % (ref 36.0–46.0)
Hemoglobin: 12.9 g/dL (ref 12.0–15.0)
Immature Granulocytes: 0 %
Lymphocytes Relative: 28 %
Lymphs Abs: 1 10*3/uL (ref 0.7–4.0)
MCH: 30.6 pg (ref 26.0–34.0)
MCHC: 32.2 g/dL (ref 30.0–36.0)
MCV: 95 fL (ref 80.0–100.0)
Monocytes Absolute: 0.2 10*3/uL (ref 0.1–1.0)
Monocytes Relative: 7 %
Neutro Abs: 2.1 10*3/uL (ref 1.7–7.7)
Neutrophils Relative %: 58 %
Platelets: 189 10*3/uL (ref 150–400)
RBC: 4.22 MIL/uL (ref 3.87–5.11)
RDW: 12.9 % (ref 11.5–15.5)
WBC: 3.6 10*3/uL — ABNORMAL LOW (ref 4.0–10.5)
nRBC: 0 % (ref 0.0–0.2)

## 2021-11-29 LAB — HIV ANTIBODY (ROUTINE TESTING W REFLEX): HIV Screen 4th Generation wRfx: NONREACTIVE

## 2021-11-29 SURGERY — ENDOSCOPIC RETROGRADE CHOLANGIOPANCREATOGRAPHY (ERCP) WITH PROPOFOL
Anesthesia: General

## 2021-11-29 MED ORDER — MIDAZOLAM HCL 2 MG/2ML IJ SOLN
INTRAMUSCULAR | Status: DC | PRN
  Administered 2021-11-29: 2 mg via INTRAVENOUS

## 2021-11-29 MED ORDER — FENTANYL CITRATE (PF) 100 MCG/2ML IJ SOLN
INTRAMUSCULAR | Status: AC
  Filled 2021-11-29: qty 2

## 2021-11-29 MED ORDER — GLUCAGON HCL RDNA (DIAGNOSTIC) 1 MG IJ SOLR
INTRAMUSCULAR | Status: AC
  Filled 2021-11-29: qty 1

## 2021-11-29 MED ORDER — LIDOCAINE 2% (20 MG/ML) 5 ML SYRINGE
INTRAMUSCULAR | Status: DC | PRN
  Administered 2021-11-29: 100 mg via INTRAVENOUS

## 2021-11-29 MED ORDER — SUGAMMADEX SODIUM 200 MG/2ML IV SOLN
INTRAVENOUS | Status: DC | PRN
  Administered 2021-11-29: 200 mg via INTRAVENOUS

## 2021-11-29 MED ORDER — ROCURONIUM BROMIDE 10 MG/ML (PF) SYRINGE
PREFILLED_SYRINGE | INTRAVENOUS | Status: DC | PRN
  Administered 2021-11-29: 70 mg via INTRAVENOUS

## 2021-11-29 MED ORDER — TRAMADOL HCL 50 MG PO TABS
25.0000 mg | ORAL_TABLET | Freq: Once | ORAL | Status: AC | PRN
  Administered 2021-11-29: 25 mg via ORAL
  Filled 2021-11-29: qty 1

## 2021-11-29 MED ORDER — DEXAMETHASONE SODIUM PHOSPHATE 10 MG/ML IJ SOLN
INTRAMUSCULAR | Status: DC | PRN
  Administered 2021-11-29: 8 mg via INTRAVENOUS

## 2021-11-29 MED ORDER — FENTANYL CITRATE (PF) 250 MCG/5ML IJ SOLN
INTRAMUSCULAR | Status: DC | PRN
  Administered 2021-11-29: 50 ug via INTRAVENOUS
  Administered 2021-11-29: 100 ug via INTRAVENOUS

## 2021-11-29 MED ORDER — SODIUM CHLORIDE 0.9 % IV SOLN: INTRAVENOUS | Status: DC

## 2021-11-29 MED ORDER — SODIUM CHLORIDE 0.9 % IV SOLN
INTRAVENOUS | Status: DC | PRN
  Administered 2021-11-29: 25 mL

## 2021-11-29 MED ORDER — PROPOFOL 10 MG/ML IV BOLUS
INTRAVENOUS | Status: DC | PRN
  Administered 2021-11-29: 150 mg via INTRAVENOUS

## 2021-11-29 MED ORDER — SIMETHICONE 80 MG PO CHEW
80.0000 mg | CHEWABLE_TABLET | Freq: Once | ORAL | Status: AC
  Administered 2021-11-29: 80 mg via ORAL
  Filled 2021-11-29: qty 1

## 2021-11-29 MED ORDER — PHENOL 1.4 % MT LIQD
1.0000 | OROMUCOSAL | Status: DC | PRN
Start: 2021-11-29 — End: 2021-12-01
  Filled 2021-11-29: qty 177

## 2021-11-29 MED ORDER — ONDANSETRON HCL 4 MG/2ML IJ SOLN
INTRAMUSCULAR | Status: DC | PRN
  Administered 2021-11-29: 4 mg via INTRAVENOUS

## 2021-11-29 MED ORDER — MIDAZOLAM HCL 2 MG/2ML IJ SOLN
INTRAMUSCULAR | Status: AC
  Filled 2021-11-29: qty 2

## 2021-11-29 MED ORDER — DICLOFENAC SUPPOSITORY 100 MG
RECTAL | Status: DC | PRN
  Administered 2021-11-29: 100 mg via RECTAL

## 2021-11-29 MED ORDER — DICLOFENAC SUPPOSITORY 100 MG
RECTAL | Status: AC
  Filled 2021-11-29: qty 1

## 2021-11-29 NOTE — Consult Note (Signed)
Referring Provider: [facility] Primary Care Physician:  Percell Belt, DO Primary Gastroenterologist:  Althia Forts  Reason for Consultation: Choledocholithiasis  HPI: Tina Mueller is a 41 y.o. female without significant past medical history presented to [facility] ED 6/12 for back pain, abdominal pain and scleral icterus. On presentation to the ED she had normal lipase, CBC. CMP significant for AST of 408, ALT of 1005, alk phos of 173, total bili 2.6.   Starting Sunday patient noted increased abdominal and back pain. Sunday night she began to have she woke up on Monday she had scleral icterus.  Pain worsened with eating.  Denies change in pain with bowel movements.  Patient notes her mother had her gallbladder removed. Denies family history of colon cancer. No previous EGD or colonoscopy.  RUQ [place] was ordered and significant for positive murphy's sign, gallbladder wall thickening, and CBD dilation to 11.3 mm. Stone noted in the CBD.   GI was consulted for choledocholithiasis.   Past Medical History:  Diagnosis Date   Eating disorder    Frequent headaches    Kyphosis    Pregnancy 08/04/2016   Seasonal allergies    UTI (urinary tract infection)     Past Surgical History:  Procedure Laterality Date   none      Prior to Admission medications   Medication Sig Start Date End Date Taking? Authorizing Provider  acetaminophen (TYLENOL) 500 MG tablet Take 500 mg by mouth every 6 (six) hours as needed for moderate pain.   Yes [provider]  HYDROcodone-acetaminophen (NORCO) 5-325 MG tablet Take 1 tablet by mouth every 4 (four) hours as needed for severe pain. 11/26/21  Yes Molpus, John, MD  levonorgestrel (MIRENA) 20 MCG/24HR IUD 1 each by Intrauterine route once.   Yes [provider]  Multiple Vitamin (MULTIVITAMIN ADULT PO) Take 1 tablet by mouth daily.   Yes [provider]    Scheduled Meds: Continuous Infusions:  lactated ringers 75 mL/hr at 11/28/21 2026    piperacillin-tazobactam (ZOSYN)  IV 3.375 g (11/29/21 0553)   PRN Meds:.oxyCODONE **OR** HYDROmorphone (DILAUDID) injection, melatonin, ondansetron **OR** ondansetron (ZOFRAN) IV  Allergies as of 11/28/2021   (No Known Allergies)    Family History  Problem Relation Age of Onset   Arthritis Maternal Grandmother    Breast cancer Maternal Grandmother    Hypertension Maternal Grandmother    Lung cancer Maternal Grandfather    Heart disease Other        Possibly grandparents   Hypertension Paternal Grandfather    Leukemia Paternal Grandfather    Hypertension Paternal Grandmother    Skin cancer Mother    Osteoporosis Other        Both sides of family.     Social History   Socioeconomic History   Marital status: Married    Spouse name: Not on file   Number of children: Not on file   Years of education: Not on file   Highest education level: Not on file  Occupational History   Not on file  Tobacco Use   Smoking status: Never   Smokeless tobacco: Never  Substance and Sexual Activity   Alcohol use: Yes    Alcohol/week: 0.0 standard drinks of alcohol    Comment: socially   Drug use: No   Sexual activity: Not on file  Other Topics Concern   Not on file  Social History Narrative   Works for [employer]   Married for 2 years    Has one son and two step  daughters    [place] Hobby to be outside.    Social Determinants of Health   Financial Resource Strain: Not on file  Food Insecurity: Not on file  Transportation Needs: Not on file  Physical Activity: Not on file  Stress: Not on file  Social Connections: Not on file  Intimate Partner Violence: Not on file    Review of Systems: Review of Systems  Constitutional:  Positive for malaise/fatigue. Negative for chills and fever.  HENT:  Negative for hearing loss and tinnitus.   Eyes:  Negative for blurred vision and double vision.  Respiratory:  Negative for cough and hemoptysis.   Cardiovascular:  Negative for chest pain and  palpitations.  Gastrointestinal:  Positive for abdominal pain and nausea. Negative for blood in stool, constipation, diarrhea, heartburn, melena and vomiting.  Genitourinary:  Negative for dysuria and urgency.  Musculoskeletal:  Positive for back pain. Negative for myalgias and neck pain.  Skin:  Negative for itching and rash.  Neurological:  Positive for weakness. Negative for dizziness and headaches.  Endo/Heme/Allergies:  Negative for environmental allergies. Does not bruise/bleed easily.  Psychiatric/Behavioral:  Negative for depression, substance abuse and suicidal ideas.      Physical Exam:Physical Exam Constitutional:      General: She is not in acute distress.    Appearance: Normal appearance. She is normal weight.  HENT:     Head: Normocephalic and atraumatic.     Right Ear: External ear normal.     Left Ear: External ear normal.     Nose: Nose normal.     Mouth/Throat:     Mouth: Mucous membranes are moist.  Eyes:     General: Scleral icterus present.     Pupils: Pupils are equal, round, and reactive to light.  Cardiovascular:     Rate and Rhythm: Normal rate and regular rhythm.     Pulses: Normal pulses.     Heart sounds: Normal heart sounds.  Pulmonary:     Effort: Pulmonary effort is normal.     Breath sounds: Normal breath sounds.  Abdominal:     General: Abdomen is flat. Bowel sounds are normal. There is no distension.     Palpations: Abdomen is soft. There is no mass.     Tenderness: There is abdominal tenderness (RUQ). There is no guarding or rebound.     Hernia: No hernia is present.  Musculoskeletal:        General: No swelling. Normal range of motion.     Cervical back: Normal range of motion and neck supple.  Skin:    General: Skin is warm and dry.     Coloration: Skin is not pale.  Neurological:     General: No focal deficit present.     Mental Status: She is alert and oriented to person, place, and time. Mental status is at baseline.  Psychiatric:         Mood and Affect: Mood normal.        Behavior: Behavior normal.     Vital signs: Vitals:   11/28/21 2301 11/29/21 0243  BP: (!) 98/58 104/72  Pulse: 73 61  Resp: 16 18  Temp: (!) 97.5 F (36.4 C) 98 F (36.7 C)  SpO2: 95% 98%   Last BM Date :  (PTA)    GI:  Lab Results: Recent Labs    11/28/21 0930 11/29/21 0520  WBC 4.9 3.6*  HGB 13.9 12.9  HCT 42.9 40.1  PLT 208 189   BMET Recent Labs  11/28/21 0930  NA 139  K 3.5  CL 103  CO2 23  GLUCOSE 87  BUN 9  CREATININE 0.56  CALCIUM 9.6   LFT Recent Labs    11/28/21 0930  PROT 8.0  ALBUMIN 4.6  AST 408*  ALT 1,005*  ALKPHOS 173*  BILITOT 2.6*  BILIDIR 1.4*   PT/INR No results for input(s): "LABPROT", "INR" in the last 72 hours.   Studies/Results: US Abdomen Limited RUQ (LIVER/GB)  Result Date: 11/28/2021 CLINICAL DATA:  Elevated LFTs EXAM: ULTRASOUND ABDOMEN LIMITED RIGHT UPPER QUADRANT COMPARISON:  None Available. FINDINGS: Gallbladder: Gallbladder wall thickening. Masslike thickening of the gallbladder fundus with associated calcifications. Positive Murphy sign noted by sonographer. Common bile duct: Diameter: 11.3 mm A stone is seen in the common bile duct. Liver: No focal lesion identified. Within normal limits in parenchymal echogenicity. Portal vein is patent on color Doppler imaging with normal direction of blood flow towards the liver. Other: None. IMPRESSION: 1. Gallbladder wall thickening with positive sonographic Murphy sign, findings can be seen in the setting of acute cholecystitis. 2. Dilated common bile duct with common bile duct stone visualized, findings are compatible with choledocholithiasis. 3. Masslike thickening of the gallbladder fundus with associated calcifications, differential considerations include localized adenomyomatosis, non mobile tumefactive sludge, or gallbladder mass. MRI with contrast could be performed for further evaluation. Electronically Signed   By: Yetta Glassman M.D.   On: 11/28/2021 11:20    Impression: Choledocholithiasis  HGB 12.9 Platelets 189 AST 275(408) ALT 782(1005)  Alkphos 172 TBili 2.4(2.6)  RUQ [place] 10/28/2021 1. Gallbladder wall thickening with positive sonographic Murphy sign, findings can be seen in the setting of acute cholecystitis. 2. Dilated common bile duct with common bile duct stone visualized, findings are compatible with choledocholithiasis. 3. Masslike thickening of the gallbladder fundus with associated calcifications, differential considerations include localized adenomyomatosis, non mobile tumefactive sludge, or gallbladder mass. MRI with contrast could be performed for further evaluation.  With findings of CBD stone, elevated LFTs, jaundice, abdominal pain, patient likely has choledocholithiasis. Patient would benefit from ERCP for removal of CBD stone. Would recommend general surgery consult to follow for evaluation of cholecystectomy.   Plan: Plan for ERCP today. I thoroughly discussed the procedures to include nature, alternatives, benefits, and risks including but not limited to bleeding, perforation, infection, anesthesia/cardiac and pulmonary complications. Patient provides understanding and gave verbal consent to proceed. NPO  Continue anti-emetics and supportive care as needed. Continue Zosyn [facility] will follow.     LOS: 1 day   Charlott Rakes  PA-C 11/29/2021, 6:07 AM  Contact #  [phone]

## 2021-11-29 NOTE — Discharge Instructions (Signed)
[facility] LAPAROSCOPIC SURGERY: POST OP INSTRUCTIONS Always review your discharge instruction sheet given to you by the facility where your surgery was performed. IF YOU HAVE DISABILITY OR FAMILY LEAVE FORMS, YOU MUST BRING THEM TO THE OFFICE FOR PROCESSING.   DO NOT GIVE THEM TO YOUR DOCTOR.  PAIN CONTROL  First take acetaminophen (Tylenol) AND/or ibuprofen (Advil) to control your pain after surgery.  Follow directions on package.  Taking acetaminophen (Tylenol) and/or ibuprofen (Advil) regularly after surgery will help to control your pain and lower the amount of prescription pain medication you may need.  You should not take more than 3,000 mg (3 grams) of acetaminophen (Tylenol) in 24 hours.  You should not take ibuprofen (Advil), aleve, motrin, naprosyn or other NSAIDS if you have a history of stomach ulcers or chronic kidney disease.  A prescription for pain medication may be given to you upon discharge.  Take your pain medication as prescribed, if you still have uncontrolled pain after taking acetaminophen (Tylenol) or ibuprofen (Advil). Use ice packs to help control pain. If you need a refill on your pain medication, please contact your pharmacy.  They will contact our office to request authorization. Prescriptions will not be filled after 5pm or on week-ends.  HOME MEDICATIONS Take your usually prescribed medications unless otherwise directed.  DIET You should follow a light diet the first few days after arrival home.  Be sure to include lots of fluids daily. Avoid fatty, fried foods.   CONSTIPATION It is common to experience some constipation after surgery and if you are taking pain medication.  Increasing fluid intake and taking a stool softener (such as Colace) will usually help or prevent this problem from occurring.  A mild laxative (Milk of Magnesia or Miralax) should be taken according to package instructions if there are no bowel movements after 48  hours.  WOUND/INCISION CARE Most patients will experience some swelling and bruising in the area of the incisions.  Ice packs will help.  Swelling and bruising can take several days to resolve.  Unless discharge instructions indicate otherwise, follow guidelines below  STERI-STRIPS - you may remove your outer bandages 48 hours after surgery, and you may shower at that time.  You have steri-strips (small skin tapes) in place directly over the incision.  These strips should be left on the skin for 7-10 days.   DERMABOND/SKIN GLUE - you may shower in 24 hours.  The glue will flake off over the next 2-3 weeks. Any sutures or staples will be removed at the office during your follow-up visit.  ACTIVITIES You may resume regular (light) daily activities beginning the next day--such as daily self-care, walking, climbing stairs--gradually increasing activities as tolerated.  You may have sexual intercourse when it is comfortable.  Refrain from any heavy lifting or straining until approved by your doctor. You may drive when you are no longer taking prescription pain medication, you can comfortably wear a seatbelt, and you can safely maneuver your car and apply brakes.  FOLLOW-UP You should see your doctor in the office for a follow-up appointment approximately 2-3 weeks after your surgery.  You should have been given your post-op/follow-up appointment when your surgery was scheduled.  If you did not receive a post-op/follow-up appointment, make sure that you call for this appointment within a day or two after you arrive home to insure a convenient appointment time.   WHEN TO CALL YOUR DOCTOR: Fever over 101.0 Inability to urinate Continued bleeding from incision.   Increased pain, redness, or drainage from the incision. Increasing abdominal pain  The clinic staff is available to answer your questions during regular business hours.  Please don't hesitate to call and ask to speak to one of the nurses for  clinical concerns.  If you have a medical emergency, go to the nearest emergency room or call 911.  A surgeon from [facility] is always on call at the hospital. [address], [address], [place], [place]  [address] ? [address] [address], [place], [place]   [address] [phone] ? [phone] ? FAX [phone] Web site: [email or web address]      Managing Your Pain After Surgery Without Opioids    Thank you for participating in our program to help patients manage their pain after surgery without opioids. This is part of our effort to provide you with the best care possible, without exposing you or your family to the risk that opioids pose.  What pain can I expect after surgery? You can expect to have some pain after surgery. This is normal. The pain is typically worse the day after surgery, and quickly begins to get better. Many studies have found that many patients are able to manage their pain after surgery with Over-the-Counter (OTC) medications such as Tylenol and Motrin. If you have a condition that does not allow you to take Tylenol or Motrin, notify your surgical team.  How will I manage my pain? The best strategy for controlling your pain after surgery is around the clock pain control with Tylenol (acetaminophen) and Motrin (ibuprofen or Advil). Alternating these medications with each other allows you to maximize your pain control. In addition to Tylenol and Motrin, you can use heating pads or ice packs on your incisions to help reduce your pain.  How will I alternate your regular strength over-the-counter pain medication? You will take a dose of pain medication every three hours. Start by taking 650 mg of Tylenol (2 pills of 325 mg) 3 hours later take 600 mg of Motrin (3 pills of 200 mg) 3 hours after taking the Motrin take 650 mg of Tylenol 3 hours after that take 600 mg of Motrin.   - 1 -  See example - if your first dose of Tylenol is at 12:00  PM   12:00 PM Tylenol 650 mg (2 pills of 325 mg)  3:00 PM Motrin 600 mg (3 pills of 200 mg)  6:00 PM Tylenol 650 mg (2 pills of 325 mg)  9:00 PM Motrin 600 mg (3 pills of 200 mg)  Continue alternating every 3 hours   We recommend that you follow this schedule around-the-clock for at least 3 days after surgery, or until you feel that it is no longer needed. Use the table on the last page of this handout to keep track of the medications you are taking. Important: Do not take more than 3000mg of Tylenol or 3200mg of Motrin in a 24-hour period. Do not take ibuprofen/Motrin if you have a history of bleeding stomach ulcers, severe kidney disease, &/or actively taking a blood thinner  What if I still have pain? If you have pain that is not controlled with the over-the-counter pain medications (Tylenol and Motrin or Advil) you might have what we call "breakthrough" pain. You will receive a prescription for a small amount of an opioid pain medication such as Oxycodone, Tramadol, or Tylenol with Codeine. Use these opioid pills in the first 24 hours after surgery if you have breakthrough pain. Do   not take more than 1 pill every 4-6 hours.  If you still have uncontrolled pain after using all opioid pills, don't hesitate to call our staff using the number provided. We will help make sure you are managing your pain in the best way possible, and if necessary, we can provide a prescription for additional pain medication.   Day 1    Time  Name of Medication Number of pills taken  Amount of Acetaminophen  Pain Level   Comments  AM PM       AM PM       AM PM       AM PM       AM PM       AM PM       AM PM       AM PM       Total Daily amount of Acetaminophen Do not take more than  3,000 mg per day      Day 2    Time  Name of Medication Number of pills taken  Amount of Acetaminophen  Pain Level   Comments  AM PM       AM PM       AM PM       AM PM       AM PM       AM PM       AM  PM       AM PM       Total Daily amount of Acetaminophen Do not take more than  3,000 mg per day      Day 3    Time  Name of Medication Number of pills taken  Amount of Acetaminophen  Pain Level   Comments  AM PM       AM PM       AM PM       AM PM          AM PM       AM PM       AM PM       AM PM       Total Daily amount of Acetaminophen Do not take more than  3,000 mg per day      Day 4    Time  Name of Medication Number of pills taken  Amount of Acetaminophen  Pain Level   Comments  AM PM       AM PM       AM PM       AM PM       AM PM       AM PM       AM PM       AM PM       Total Daily amount of Acetaminophen Do not take more than  3,000 mg per day      Day 5    Time  Name of Medication Number of pills taken  Amount of Acetaminophen  Pain Level   Comments  AM PM       AM PM       AM PM       AM PM       AM PM       AM PM       AM PM       AM PM       Total Daily amount of Acetaminophen Do not take more than    3,000 mg per day       Day 6    Time  Name of Medication Number of pills taken  Amount of Acetaminophen  Pain Level  Comments  AM PM       AM PM       AM PM       AM PM       AM PM       AM PM       AM PM       AM PM       Total Daily amount of Acetaminophen Do not take more than  3,000 mg per day      Day 7    Time  Name of Medication Number of pills taken  Amount of Acetaminophen  Pain Level   Comments  AM PM       AM PM       AM PM       AM PM       AM PM       AM PM       AM PM       AM PM       Total Daily amount of Acetaminophen Do not take more than  3,000 mg per day        For additional information about how and where to safely dispose of unused opioid medications - https://www.morepowerful[employer].org  Disclaimer: This document contains information and/or instructional materials adapted from [facility] for the typical patient with your condition. It does not replace medical advice  from your health care provider because your experience may differ from that of the typical patient. Talk to your health care provider if you have any questions about this document, your condition or your treatment plan. Adapted from [facility]  

## 2021-11-29 NOTE — Progress Notes (Signed)
[facility] Progress Note  Patient: Tina Mueller     FTD:[id number]  DOA: 11/28/2021   PCP: Percell Belt, DO       Brief hospital course: This is a 41 year old female with back pain and thoracic kyphosis who presents to the hospital for increasing back pain. She was seen in the ED on 6/10 treated with IV pain medications.  X-rays were found to be unrevealing and she was discharged home.  She returned to the ED on 6/12 and was noted to have scleral icterus and orange urine. Work-up revealed a T. bili of 2.6 AST 408 ALT 1005 and alk phos of 173.  Right upper quadrant ultrasound revealed gallbladder wall thickening and positive Murphy sign.  Common bile duct was 11.3 mm and a stone was noted in the duct.  GI was consulted and the patient was admitted.   Subjective:  She states the back pain that she presented with has improved significantly.  She has no abdominal pain nausea or vomiting today.  Assessment and Plan: Principal Problem:   Choledocholithiasis  Cholelithiasis   Elevated LFTs   Acute cholecystitis -  LFTs have improved.  Her severe back pain has improved and she has not required pain medication since yesterday. -Continue Zosyn for acute cholecystitis-General surgery plans to do a lap chole tomorrow - Continue IV fluids 75 cc an hour as she is n.p.o. -Although her bilirubin is still elevated, there is no scleral icterus on exam  Active Problems: Chronic back pain/thoracic kyphosis -Pain currently in her mid thoracic spine and just lateral to the spine - Following as outpatient with chiropractor & has received acupuncture In-hospital meds include oxycodone and IV Dilaudid DVT prophylaxis:  SCDs Start: 11/28/21 2005    Code Status: Full Code  Consultants: GI and general surgery Level of Care: Level of care: Telemetry Disposition Plan:  Status is: Inpatient Remains inpatient appropriate because: Acute cholecystitis and choledocholithiasis  Objective:    Vitals:   11/28/21 1903 11/28/21 2301 11/29/21 0243 11/29/21 0635  BP:  (!) 98/58 104/72 118/81  Pulse:  73 61 70  Resp:  16 18 18   Temp:  (!) 97.5 F (36.4 C) 98 F (36.7 C) 98.3 F (36.8 C)  TempSrc:  Oral Oral Oral  SpO2:  95% 98% 96%  Weight: 102 kg     Height:       Filed Weights   11/28/21 1903  Weight: 102 kg   Exam: General exam: Appears comfortable  HEENT: PERRLA, oral mucosa moist, no sclera icterus or thrush Respiratory system: Clear to auscultation. Respiratory effort normal. Cardiovascular system: S1 & S2 heard, regular rate and rhythm Gastrointestinal system: Abdomen soft, tender in right upper quadrant and epigastrium, nondistended. Normal bowel sounds   Central nervous system: Alert and oriented. No focal neurological deficits. Extremities: No cyanosis, clubbing or edema Skin: No rashes or ulcers Psychiatry:  Mood & affect appropriate.    Imaging and lab data was personally reviewed    CBC: Recent Labs  Lab 11/28/21 0930 11/29/21 0520  WBC 4.9 3.6*  NEUTROABS 3.1 2.1  HGB 13.9 12.9  HCT 42.9 40.1  MCV 93.9 95.0  PLT 208 062   Basic Metabolic Panel: Recent Labs  Lab 11/28/21 0930 11/29/21 0520  NA 139 141  K 3.5 3.9  CL 103 110  CO2 23 25  GLUCOSE 87 87  BUN 9 12  CREATININE 0.56 0.73  CALCIUM 9.6 8.7*   GFR: Estimated Creatinine Clearance: 122.9 mL/min (by C-G formula  based on SCr of 0.73 mg/dL).  Scheduled Meds:  Continuous Infusions:  lactated ringers 75 mL/hr at 11/28/21 2026   piperacillin-tazobactam (ZOSYN)  IV 3.375 g (11/29/21 0553)     LOS: 1 day   Author: Debbe Odea  11/29/2021 7:53 AM

## 2021-11-29 NOTE — H&P (View-Only) (Signed)
Reason for Consult:Choledocholithiasis/ acute cholecystitis Referring Physician: Dr. Rizwan  Tina Mueller is an 41 y.o. female.  HPI: This is a 41 year old female with no significant past medical history and no surgical history who presented with several weeks of worsening back pain.  She has been treated by chiropractor, acupuncture, and PT with no improvement.  Symptoms seemed to worsen with eating, associated with bloating and abdominal pain.  Especially exacerbated by fatty foods.  She presented to the ED 6/12 with clinical jaundice.  She was noted to have a T. Bili of 2.6 and US showed choledocholithiasis as well as signs of acute cholecystitis.    She has been seen by [facility] who are planning ERCP today.  We are consulted for eventual cholecystectomy.  Past Medical History:  Diagnosis Date   Eating disorder    Frequent headaches    Kyphosis    Pregnancy 08/04/2016   Seasonal allergies    UTI (urinary tract infection)     Past Surgical History:  Procedure Laterality Date   none      Family History  Problem Relation Age of Onset   Arthritis Maternal Grandmother    Breast cancer Maternal Grandmother    Hypertension Maternal Grandmother    Lung cancer Maternal Grandfather    Heart disease Other        Possibly grandparents   Hypertension Paternal Grandfather    Leukemia Paternal Grandfather    Hypertension Paternal Grandmother    Skin cancer Mother    Osteoporosis Other        Both sides of family.     Social History:  reports that she has never smoked. She has never used smokeless tobacco. She reports current alcohol use. She reports that she does not use drugs.  Allergies: No Known Allergies  Medications:  Prior to Admission medications   Medication Sig Start Date End Date Taking? Authorizing Provider  acetaminophen (TYLENOL) 500 MG tablet Take 500 mg by mouth every 6 (six) hours as needed for moderate pain.   Yes [provider]   HYDROcodone-acetaminophen (NORCO) 5-325 MG tablet Take 1 tablet by mouth every 4 (four) hours as needed for severe pain. 11/26/21  Yes Molpus, John, MD  levonorgestrel (MIRENA) 20 MCG/24HR IUD 1 each by Intrauterine route once.   Yes [provider]  Multiple Vitamin (MULTIVITAMIN ADULT PO) Take 1 tablet by mouth daily.   Yes [provider]     Results for orders placed or performed during the hospital encounter of 11/28/21 (from the past 48 hour(s))  CBC with Differential     Status: None   Collection Time: 11/28/21  9:30 AM  Result Value Ref Range   WBC 4.9 4.0 - 10.5 K/uL   RBC 4.57 3.87 - 5.11 MIL/uL   Hemoglobin 13.9 12.0 - 15.0 g/dL   HCT 42.9 36.0 - 46.0 %   MCV 93.9 80.0 - 100.0 fL   MCH 30.4 26.0 - 34.0 pg   MCHC 32.4 30.0 - 36.0 g/dL   RDW 12.8 11.5 - 15.5 %   Platelets 208 150 - 400 K/uL   nRBC 0.0 0.0 - 0.2 %   Neutrophils Relative % 64 %   Neutro Abs 3.1 1.7 - 7.7 K/uL   Lymphocytes Relative 23 %   Lymphs Abs 1.1 0.7 - 4.0 K/uL   Monocytes Relative 7 %   Monocytes Absolute 0.4 0.1 - 1.0 K/uL   Eosinophils Relative 5 %   Eosinophils Absolute 0.3 0.0 -   0.5 K/uL   Basophils Relative 1 %   Basophils Absolute 0.1 0.0 - 0.1 K/uL   Immature Granulocytes 0 %   Abs Immature Granulocytes 0.01 0.00 - 0.07 K/uL    Comment: Performed at [facility], [address], [place], [place] [address]  Comprehensive metabolic panel     Status: Abnormal   Collection Time: 11/28/21  9:30 AM  Result Value Ref Range   Sodium 139 135 - 145 mmol/L   Potassium 3.5 3.5 - 5.1 mmol/L   Chloride 103 98 - 111 mmol/L   CO2 23 22 - 32 mmol/L   Glucose, Bld 87 70 - 99 mg/dL    Comment: Glucose reference range applies only to samples taken after fasting for at least 8 hours.   BUN 9 6 - 20 mg/dL   Creatinine, Ser 0.56 0.44 - 1.00 mg/dL   Calcium 9.6 8.9 - 10.3 mg/dL   Total Protein 8.0 6.5 - 8.1 g/dL   Albumin 4.6 3.5 - 5.0 g/dL   AST 408 (H) 15 - 41 U/L    ALT 1,005 (H) 0 - 44 U/L   Alkaline Phosphatase 173 (H) 38 - 126 U/L   Total Bilirubin 2.6 (H) 0.3 - 1.2 mg/dL   GFR, Estimated >60 >60 mL/min    Comment: (NOTE) Calculated using the CKD-EPI Creatinine Equation (2021)    Anion gap 13 5 - 15    Comment: Performed at [facility], [address], [place], [place] [address]  Urinalysis, Routine w reflex microscopic Urine, Clean Catch     Status: Abnormal   Collection Time: 11/28/21  9:30 AM  Result Value Ref Range   Color, Urine YELLOW YELLOW   APPearance CLEAR CLEAR   Specific Gravity, Urine 1.010 1.005 - 1.030   pH 6.5 5.0 - 8.0   Glucose, UA NEGATIVE NEGATIVE mg/dL   Hgb urine dipstick NEGATIVE NEGATIVE   Bilirubin Urine SMALL (A) NEGATIVE   Ketones, ur NEGATIVE NEGATIVE mg/dL   Protein, ur NEGATIVE NEGATIVE mg/dL   Nitrite NEGATIVE NEGATIVE   Leukocytes,Ua TRACE (A) NEGATIVE   RBC / HPF 0-5 0 - 5 RBC/hpf   WBC, UA 0-5 0 - 5 WBC/hpf   Squamous Epithelial / LPF 0-5 0 - 5   Mucus PRESENT     Comment: Performed at [facility], [address], [place], [place] [address]  Pregnancy, urine     Status: None   Collection Time: 11/28/21  9:30 AM  Result Value Ref Range   Preg Test, Ur NEGATIVE NEGATIVE    Comment:        THE SENSITIVITY OF THIS METHODOLOGY IS >20 mIU/mL. Performed at [facility], [address], [place], [place] [address]   Lipase, blood     Status: None   Collection Time: 11/28/21  9:30 AM  Result Value Ref Range   Lipase 18 11 - 51 U/L    Comment: Performed at [facility], [address], [place], [place] [address]  Bilirubin, direct     Status: Abnormal   Collection Time: 11/28/21  9:30 AM  Result Value Ref Range   Bilirubin, Direct 1.4 (H) 0.0 - 0.2 mg/dL    Comment: Performed at [facility], [address], [place],  [address]  Hepatitis panel, acute     Status: None   Collection Time: 11/28/21  11:35 AM  Result Value Ref Range   Hepatitis B Surface Ag NON REACTIVE NON REACTIVE   HCV Ab   NON REACTIVE NON REACTIVE    Comment: (NOTE) Nonreactive HCV antibody screen is consistent with no HCV infections,  unless recent infection is suspected or other evidence exists to indicate HCV infection.     Hep A IgM NON REACTIVE NON REACTIVE   Hep B C IgM NON REACTIVE NON REACTIVE    Comment: Performed at [facility], [address] [place], [place] [address]  Comprehensive metabolic panel     Status: Abnormal   Collection Time: 11/29/21  5:20 AM  Result Value Ref Range   Sodium 141 135 - 145 mmol/L   Potassium 3.9 3.5 - 5.1 mmol/L   Chloride 110 98 - 111 mmol/L   CO2 25 22 - 32 mmol/L   Glucose, Bld 87 70 - 99 mg/dL    Comment: Glucose reference range applies only to samples taken after fasting for at least 8 hours.   BUN 12 6 - 20 mg/dL   Creatinine, Ser 0.73 0.44 - 1.00 mg/dL   Calcium 8.7 (L) 8.9 - 10.3 mg/dL   Total Protein 6.6 6.5 - 8.1 g/dL   Albumin 3.6 3.5 - 5.0 g/dL   AST 275 (H) 15 - 41 U/L   ALT 782 (H) 0 - 44 U/L    Comment: RESULTS CONFIRMED BY MANUAL DILUTION   Alkaline Phosphatase 172 (H) 38 - 126 U/L   Total Bilirubin 2.4 (H) 0.3 - 1.2 mg/dL   GFR, Estimated >60 >60 mL/min    Comment: (NOTE) Calculated using the CKD-EPI Creatinine Equation (2021)    Anion gap 6 5 - 15    Comment: Performed at [facility], [address] [place], [place] [address]  CBC with Differential/Platelet     Status: Abnormal   Collection Time: 11/29/21  5:20 AM  Result Value Ref Range   WBC 3.6 (L) 4.0 - 10.5 K/uL   RBC 4.22 3.87 - 5.11 MIL/uL   Hemoglobin 12.9 12.0 - 15.0 g/dL   HCT 40.1 36.0 - 46.0 %   MCV 95.0 80.0 - 100.0 fL   MCH 30.6 26.0 - 34.0 pg   MCHC 32.2 30.0 - 36.0 g/dL   RDW 12.9 11.5 - 15.5 %   Platelets 189 150 - 400 K/uL   nRBC 0.0 0.0 - 0.2 %   Neutrophils Relative % 58 %   Neutro Abs 2.1 1.7 - 7.7 K/uL   Lymphocytes Relative 28 %    Lymphs Abs 1.0 0.7 - 4.0 K/uL   Monocytes Relative 7 %   Monocytes Absolute 0.2 0.1 - 1.0 K/uL   Eosinophils Relative 6 %   Eosinophils Absolute 0.2 0.0 - 0.5 K/uL   Basophils Relative 1 %   Basophils Absolute 0.0 0.0 - 0.1 K/uL   Immature Granulocytes 0 %   Abs Immature Granulocytes 0.01 0.00 - 0.07 K/uL    Comment: Performed at  [facility], [address] [place], [place] [address]    US Abdomen Limited RUQ (LIVER/GB)  Result Date: 11/28/2021 CLINICAL DATA:  Elevated LFTs EXAM: ULTRASOUND ABDOMEN LIMITED RIGHT UPPER QUADRANT COMPARISON:  None Available. FINDINGS: Gallbladder: Gallbladder wall thickening. Masslike thickening of the gallbladder fundus with associated calcifications. Positive Murphy sign noted by sonographer. Common bile duct: Diameter: 11.3 mm A stone is seen in the common bile duct. Liver: No focal lesion identified. Within normal limits in parenchymal echogenicity. Portal vein is patent on color Doppler imaging with normal direction of blood flow towards the liver. Other: None. IMPRESSION: 1. Gallbladder wall   thickening with positive sonographic Murphy sign, findings can be seen in the setting of acute cholecystitis. 2. Dilated common bile duct with common bile duct stone visualized, findings are compatible with choledocholithiasis. 3. Masslike thickening of the gallbladder fundus with associated calcifications, differential considerations include localized adenomyomatosis, non mobile tumefactive sludge, or gallbladder mass. MRI with contrast could be performed for further evaluation. Electronically Signed   By: Leah  Strickland M.D.   On: 11/28/2021 11:20    Review of Systems  HENT:  Negative for ear discharge, ear pain, hearing loss and tinnitus.   Eyes:  Negative for photophobia and pain.  Respiratory:  Negative for cough and shortness of breath.   Cardiovascular:  Negative for chest pain.  Gastrointestinal:  Positive for abdominal distention, abdominal  pain and nausea. Negative for vomiting.  Genitourinary:  Negative for dysuria, flank pain, frequency and urgency.  Musculoskeletal:  Positive for back pain. Negative for myalgias and neck pain.  Neurological:  Negative for dizziness and headaches.  Hematological:  Does not bruise/bleed easily.  Psychiatric/Behavioral:  The patient is not nervous/anxious.    Blood pressure 118/81, pulse 70, temperature 98.3 F (36.8 C), temperature source Oral, resp. rate 18, height 5' 11" (1.803 m), weight 102 kg, SpO2 96 %, unknown if currently breastfeeding.  Constitutional:  WDWN in NAD, conversant, no obvious deformities; lying in bed comfortably Eyes:  Pupils equal, round; sclera icteric; moist conjunctiva; no lid lag HENT:  Oral mucosa moist; good dentition  Neck:  No masses palpated, trachea midline; no thyromegaly Lungs:  CTA bilaterally; normal respiratory effort CV:  Regular rate and rhythm; no murmurs; extremities well-perfused with no edema Abd:  +bowel sounds, soft, tender in RUQ, no palpable organomegaly; no palpable hernias Musc:  Unable to assess gait; no apparent clubbing or cyanosis in extremities Lymphatic:  No palpable cervical or axillary lymphadenopathy Skin:  Warm, dry; no sign of jaundice Psychiatric - alert and oriented x 4; calm mood and affect   Assessment/Plan: Acute calculus cholecystitis Choledocholithiasis with obstruction - ERCP by [facility] today Continue IV antibiotics Will need laparoscopic cholecystectomy this admission, probably tomorrow.  Please keep her NPO after midnight tonight. Discussed procedure with the patient.  Kalyani Maeda K Zadie Deemer 11/29/2021, 7:39 AM      

## 2021-11-29 NOTE — Anesthesia Preprocedure Evaluation (Signed)
Anesthesia Evaluation  Patient identified by MRN, date of birth, ID band Patient awake    Reviewed: Allergy & Precautions, NPO status , Patient's Chart, lab work & pertinent test results  Airway Mallampati: II  TM Distance: >3 FB Neck ROM: Full    Dental no notable dental hx.    Pulmonary neg pulmonary ROS,    Pulmonary exam normal breath sounds clear to auscultation       Cardiovascular negative cardio ROS Normal cardiovascular exam Rhythm:Regular Rate:Normal     Neuro/Psych negative neurological ROS  negative psych ROS   GI/Hepatic negative GI ROS, Neg liver ROS, CBD stone with increased LFT's   Endo/Other  negative endocrine ROS  Renal/GU negative Renal ROS  negative genitourinary   Musculoskeletal negative musculoskeletal ROS (+)   Abdominal   Peds negative pediatric ROS (+)  Hematology negative hematology ROS (+)   Anesthesia Other Findings   Reproductive/Obstetrics negative OB ROS                             Anesthesia Physical Anesthesia Plan  ASA: 2  Anesthesia Plan: General   Post-op Pain Management:    Induction: Intravenous  PONV Risk Score and Plan: 3 and Ondansetron, Dexamethasone, Midazolam and Treatment may vary due to age or medical condition  Airway Management Planned: Oral ETT  Additional Equipment:   Intra-op Plan:   Post-operative Plan: Extubation in OR  Informed Consent: I have reviewed the patients History and Physical, chart, labs and discussed the procedure including the risks, benefits and alternatives for the proposed anesthesia with the patient or authorized representative who has indicated his/her understanding and acceptance.     Dental advisory given  Plan Discussed with: CRNA and Surgeon  Anesthesia Plan Comments:         Anesthesia Quick Evaluation

## 2021-11-29 NOTE — Consult Note (Signed)
Reason for Consult:Choledocholithiasis/ acute cholecystitis Referring Physician: Dr. Graylon Mueller is an 41 y.o. female.  HPI: This is a 41 year old female with no significant past medical history and no surgical history who presented with several weeks of worsening back pain.  She has been treated by chiropractor, acupuncture, and PT with no improvement.  Symptoms seemed to worsen with eating, associated with bloating and abdominal pain.  Especially exacerbated by fatty foods.  She presented to the ED 6/12 with clinical jaundice.  She was noted to have a T. Bili of 2.6 and US showed choledocholithiasis as well as signs of acute cholecystitis.    She has been seen by [facility] who are planning ERCP today.  We are consulted for eventual cholecystectomy.  Past Medical History:  Diagnosis Date   Eating disorder    Frequent headaches    Kyphosis    Pregnancy 08/04/2016   Seasonal allergies    UTI (urinary tract infection)     Past Surgical History:  Procedure Laterality Date   none      Family History  Problem Relation Age of Onset   Arthritis Maternal Grandmother    Breast cancer Maternal Grandmother    Hypertension Maternal Grandmother    Lung cancer Maternal Grandfather    Heart disease Other        Possibly grandparents   Hypertension Paternal Grandfather    Leukemia Paternal Grandfather    Hypertension Paternal Grandmother    Skin cancer Mother    Osteoporosis Other        Both sides of family.     Social History:  reports that she has never smoked. She has never used smokeless tobacco. She reports current alcohol use. She reports that she does not use drugs.  Allergies: No Known Allergies  Medications:  Prior to Admission medications   Medication Sig Start Date End Date Taking? Authorizing Provider  acetaminophen (TYLENOL) 500 MG tablet Take 500 mg by mouth every 6 (six) hours as needed for moderate pain.   Yes [provider]   HYDROcodone-acetaminophen (NORCO) 5-325 MG tablet Take 1 tablet by mouth every 4 (four) hours as needed for severe pain. 11/26/21  Yes Molpus, John, MD  levonorgestrel (MIRENA) 20 MCG/24HR IUD 1 each by Intrauterine route once.   Yes [provider]  Multiple Vitamin (MULTIVITAMIN ADULT PO) Take 1 tablet by mouth daily.   Yes [provider]     Results for orders placed or performed during the hospital encounter of 11/28/21 (from the past 48 hour(s))  CBC with Differential     Status: None   Collection Time: 11/28/21  9:30 AM  Result Value Ref Range   WBC 4.9 4.0 - 10.5 K/uL   RBC 4.57 3.87 - 5.11 MIL/uL   Hemoglobin 13.9 12.0 - 15.0 g/dL   HCT 42.9 36.0 - 46.0 %   MCV 93.9 80.0 - 100.0 fL   MCH 30.4 26.0 - 34.0 pg   MCHC 32.4 30.0 - 36.0 g/dL   RDW 12.8 11.5 - 15.5 %   Platelets 208 150 - 400 K/uL   nRBC 0.0 0.0 - 0.2 %   Neutrophils Relative % 64 %   Neutro Abs 3.1 1.7 - 7.7 K/uL   Lymphocytes Relative 23 %   Lymphs Abs 1.1 0.7 - 4.0 K/uL   Monocytes Relative 7 %   Monocytes Absolute 0.4 0.1 - 1.0 K/uL   Eosinophils Relative 5 %   Eosinophils Absolute 0.3 0.0 -  0.5 K/uL   Basophils Relative 1 %   Basophils Absolute 0.1 0.0 - 0.1 K/uL   Immature Granulocytes 0 %   Abs Immature Granulocytes 0.01 0.00 - 0.07 K/uL    Comment: Performed at [facility], [address] [place], [place] [address]  Comprehensive metabolic panel     Status: Abnormal   Collection Time: 11/28/21  9:30 AM  Result Value Ref Range   Sodium 139 135 - 145 mmol/L   Potassium 3.5 3.5 - 5.1 mmol/L   Chloride 103 98 - 111 mmol/L   CO2 23 22 - 32 mmol/L   Glucose, Bld 87 70 - 99 mg/dL    Comment: Glucose reference range applies only to samples taken after fasting for at least 8 hours.   BUN 9 6 - 20 mg/dL   Creatinine, Ser 0.56 0.44 - 1.00 mg/dL   Calcium 9.6 8.9 - 10.3 mg/dL   Total Protein 8.0 6.5 - 8.1 g/dL   Albumin 4.6 3.5 - 5.0 g/dL   AST 408 (H) 15 - 41 U/L    ALT 1,005 (H) 0 - 44 U/L   Alkaline Phosphatase 173 (H) 38 - 126 U/L   Total Bilirubin 2.6 (H) 0.3 - 1.2 mg/dL   GFR, Estimated >60 >60 mL/min    Comment: (NOTE) Calculated using the CKD-EPI Creatinine Equation (2021)    Anion gap 13 5 - 15    Comment: Performed at [facility], [place], [place] [address]  Urinalysis, Routine w reflex microscopic Urine, Clean Catch     Status: Abnormal   Collection Time: 11/28/21  9:30 AM  Result Value Ref Range   Color, Urine YELLOW YELLOW   APPearance CLEAR CLEAR   Specific Gravity, Urine 1.010 1.005 - 1.030   pH 6.5 5.0 - 8.0   Glucose, UA NEGATIVE NEGATIVE mg/dL   Hgb urine dipstick NEGATIVE NEGATIVE   Bilirubin Urine SMALL (A) NEGATIVE   Ketones, ur NEGATIVE NEGATIVE mg/dL   Protein, ur NEGATIVE NEGATIVE mg/dL   Nitrite NEGATIVE NEGATIVE   Leukocytes,Ua TRACE (A) NEGATIVE   RBC / HPF 0-5 0 - 5 RBC/hpf   WBC, UA 0-5 0 - 5 WBC/hpf   Squamous Epithelial / LPF 0-5 0 - 5   Mucus PRESENT     Comment: Performed at [facility], [address], [place], [place] [address]  Pregnancy, urine     Status: None   Collection Time: 11/28/21  9:30 AM  Result Value Ref Range   Preg Test, Ur NEGATIVE NEGATIVE    Comment:        THE SENSITIVITY OF THIS METHODOLOGY IS >20 mIU/mL. Performed at [facility], [address] [place], [place] [address]   Lipase, blood     Status: None   Collection Time: 11/28/21  9:30 AM  Result Value Ref Range   Lipase 18 11 - 51 U/L    Comment: Performed at [facility], [place], [place] [address]  Bilirubin, direct     Status: Abnormal   Collection Time: 11/28/21  9:30 AM  Result Value Ref Range   Bilirubin, Direct 1.4 (H) 0.0 - 0.2 mg/dL    Comment: Performed at [facility], [address] [place], [place] [address]  Hepatitis panel, acute     Status: None   Collection Time: 11/28/21  11:35 AM  Result Value Ref Range   Hepatitis B Surface Ag NON REACTIVE NON REACTIVE   HCV Ab  NON REACTIVE NON REACTIVE    Comment: (NOTE) Nonreactive HCV antibody screen is consistent with no HCV infections,  unless recent infection is suspected or other evidence exists to indicate HCV infection.     Hep A IgM NON REACTIVE NON REACTIVE   Hep B C IgM NON REACTIVE NON REACTIVE    Comment: Performed at [facility], [place] [address], [place], [place] [address]  Comprehensive metabolic panel     Status: Abnormal   Collection Time: 11/29/21  5:20 AM  Result Value Ref Range   Sodium 141 135 - 145 mmol/L   Potassium 3.9 3.5 - 5.1 mmol/L   Chloride 110 98 - 111 mmol/L   CO2 25 22 - 32 mmol/L   Glucose, Bld 87 70 - 99 mg/dL    Comment: Glucose reference range applies only to samples taken after fasting for at least 8 hours.   BUN 12 6 - 20 mg/dL   Creatinine, Ser 0.73 0.44 - 1.00 mg/dL   Calcium 8.7 (L) 8.9 - 10.3 mg/dL   Total Protein 6.6 6.5 - 8.1 g/dL   Albumin 3.6 3.5 - 5.0 g/dL   AST 275 (H) 15 - 41 U/L   ALT 782 (H) 0 - 44 U/L    Comment: RESULTS CONFIRMED BY MANUAL DILUTION   Alkaline Phosphatase 172 (H) 38 - 126 U/L   Total Bilirubin 2.4 (H) 0.3 - 1.2 mg/dL   GFR, Estimated >60 >60 mL/min    Comment: (NOTE) Calculated using the CKD-EPI Creatinine Equation (2021)    Anion gap 6 5 - 15    Comment: Performed at [facility], [place] [address] [place], [place] [address]  CBC with Differential/Platelet     Status: Abnormal   Collection Time: 11/29/21  5:20 AM  Result Value Ref Range   WBC 3.6 (L) 4.0 - 10.5 K/uL   RBC 4.22 3.87 - 5.11 MIL/uL   Hemoglobin 12.9 12.0 - 15.0 g/dL   HCT 40.1 36.0 - 46.0 %   MCV 95.0 80.0 - 100.0 fL   MCH 30.6 26.0 - 34.0 pg   MCHC 32.2 30.0 - 36.0 g/dL   RDW 12.9 11.5 - 15.5 %   Platelets 189 150 - 400 K/uL   nRBC 0.0 0.0 - 0.2 %   Neutrophils Relative % 58 %   Neutro Abs 2.1 1.7 - 7.7 K/uL   Lymphocytes Relative 28 %    Lymphs Abs 1.0 0.7 - 4.0 K/uL   Monocytes Relative 7 %   Monocytes Absolute 0.2 0.1 - 1.0 K/uL   Eosinophils Relative 6 %   Eosinophils Absolute 0.2 0.0 - 0.5 K/uL   Basophils Relative 1 %   Basophils Absolute 0.0 0.0 - 0.1 K/uL   Immature Granulocytes 0 %   Abs Immature Granulocytes 0.01 0.00 - 0.07 K/uL    Comment: Performed at [facility], [place] [address] [place], [place] [address]    US Abdomen Limited RUQ (LIVER/GB)  Result Date: 11/28/2021 CLINICAL DATA:  Elevated LFTs EXAM: ULTRASOUND ABDOMEN LIMITED RIGHT UPPER QUADRANT COMPARISON:  None Available. FINDINGS: Gallbladder: Gallbladder wall thickening. Masslike thickening of the gallbladder fundus with associated calcifications. Positive Murphy sign noted by sonographer. Common bile duct: Diameter: 11.3 mm A stone is seen in the common bile duct. Liver: No focal lesion identified. Within normal limits in parenchymal echogenicity. Portal vein is patent on color Doppler imaging with normal direction of blood flow towards the liver. Other: None. IMPRESSION: 1. Gallbladder wall  thickening with positive sonographic Murphy sign, findings can be seen in the setting of acute cholecystitis. 2. Dilated common bile duct with common bile duct stone visualized, findings are compatible with choledocholithiasis. 3. Masslike thickening of the gallbladder fundus with associated calcifications, differential considerations include localized adenomyomatosis, non mobile tumefactive sludge, or gallbladder mass. MRI with contrast could be performed for further evaluation. Electronically Signed   By: Yetta Glassman M.D.   On: 11/28/2021 11:20    Review of Systems  HENT:  Negative for ear discharge, ear pain, hearing loss and tinnitus.   Eyes:  Negative for photophobia and pain.  Respiratory:  Negative for cough and shortness of breath.   Cardiovascular:  Negative for chest pain.  Gastrointestinal:  Positive for abdominal distention, abdominal  pain and nausea. Negative for vomiting.  Genitourinary:  Negative for dysuria, flank pain, frequency and urgency.  Musculoskeletal:  Positive for back pain. Negative for myalgias and neck pain.  Neurological:  Negative for dizziness and headaches.  Hematological:  Does not bruise/bleed easily.  Psychiatric/Behavioral:  The patient is not nervous/anxious.    Blood pressure 118/81, pulse 70, temperature 98.3 F (36.8 C), temperature source Oral, resp. rate 18, height 5\' 11"  (1.803 m), weight 102 kg, SpO2 96 %, unknown if currently breastfeeding.  Constitutional:  WDWN in NAD, conversant, no obvious deformities; lying in bed comfortably Eyes:  Pupils equal, round; sclera icteric; moist conjunctiva; no lid lag HENT:  Oral mucosa moist; Mueller dentition  Neck:  No masses palpated, trachea midline; no thyromegaly Lungs:  CTA bilaterally; normal respiratory effort CV:  Regular rate and rhythm; no murmurs; extremities well-perfused with no edema Abd:  +bowel sounds, soft, tender in RUQ, no palpable organomegaly; no palpable hernias Musc:  Unable to assess gait; no apparent clubbing or cyanosis in extremities Lymphatic:  No palpable cervical or axillary lymphadenopathy Skin:  Warm, dry; no sign of jaundice Psychiatric - alert and oriented x 4; calm mood and affect   Assessment/Plan: Acute calculus cholecystitis Choledocholithiasis with obstruction - ERCP by [facility] today Continue IV antibiotics Will need laparoscopic cholecystectomy this admission, probably tomorrow.  Please keep her NPO after midnight tonight. Discussed procedure with the patient.  [facility] 11/29/2021, 7:39 AM

## 2021-11-29 NOTE — Transfer of Care (Signed)
Immediate Anesthesia Transfer of Care Note  Patient: Tina Mueller  Procedure(s) Performed: ENDOSCOPIC RETROGRADE CHOLANGIOPANCREATOGRAPHY (ERCP) WITH PROPOFOL SPHINCTEROTOMY REMOVAL OF STONES  Patient Location: PACU and Endoscopy Unit  Anesthesia Type:General  Level of Consciousness: awake, alert  and patient cooperative  Airway & Oxygen Therapy: Patient Spontanous Breathing and Patient connected to face mask oxygen  Post-op Assessment: Report given to RN and Post -op Vital signs reviewed and stable  Post vital signs: Reviewed and stable  Last Vitals:  Vitals Value Taken Time  BP    Temp    Pulse    Resp    SpO2      Last Pain:  Vitals:   11/29/21 1233  TempSrc: Tympanic  PainSc: 0-No pain         Complications: No notable events documented.

## 2021-11-29 NOTE — Anesthesia Procedure Notes (Signed)
Procedure Name: Intubation Date/Time: 11/29/2021 1:22 PM  Performed by: Eben Burow, CRNAPre-anesthesia Checklist: Patient identified, Emergency Drugs available, Suction available, Patient being monitored and Timeout performed Patient Re-evaluated:Patient Re-evaluated prior to induction Oxygen Delivery Method: Circle system utilized Preoxygenation: Pre-oxygenation with 100% oxygen Induction Type: IV induction Ventilation: Mask ventilation without difficulty Laryngoscope Size: Mac and 4 Grade View: Grade I Tube type: Oral Tube size: 7.0 mm Number of attempts: 1 Airway Equipment and Method: Stylet Placement Confirmation: ETT inserted through vocal cords under direct vision, positive ETCO2 and breath sounds checked- equal and bilateral Secured at: 22 cm Tube secured with: Tape Dental Injury: Teeth and Oropharynx as per pre-operative assessment

## 2021-11-29 NOTE — Op Note (Signed)
[facility] Patient Name: Tina Mueller Procedure Date: 11/29/2021 MRN: [id number] Attending MD: Kerin Salen , MD Date of Birth: [birth year] CSN: [id number] Age: 41 Admit Type: Inpatient Procedure:                ERCP Indications:              Common bile duct stone(s), Jaundice, Elevated liver                            enzymes Providers:                Kerin Salen, MD, Nena Polio, RN, Kandice Robinsons,                            Technician, Irene Shipper, Technician Referring MD:             [facility] Medicines:                Monitored Anesthesia Care Complications:            No immediate complications. Estimated Blood Loss:     Estimated blood loss: none. Procedure:                Pre-Anesthesia Assessment:                           - Prior to the procedure, a History and Physical                            was performed, and patient medications and                            allergies were reviewed. The patient's tolerance of                            previous anesthesia was also reviewed. The risks                            and benefits of the procedure and the sedation                            options and risks were discussed with the patient.                            All questions were answered, and informed consent                            was obtained. Prior Anticoagulants: The patient has                            taken no previous anticoagulant or antiplatelet                            agents. ASA Grade Assessment: II - A patient with  mild systemic disease. After reviewing the risks                            and benefits, the patient was deemed in                            satisfactory condition to undergo the procedure.                           After obtaining informed consent, the scope was                            passed under direct vision. Throughout the                            procedure, the patient's  blood pressure, pulse, and                            oxygen saturations were monitored continuously. The                            [id number]-Q190V ([id number] Olympus duodenoscope was                            introduced through the mouth, and used to inject                            contrast into and used to inject contrast into the                            bile duct. The ERCP was accomplished without                            difficulty. The patient tolerated the procedure                            well. Scope In: Scope Out: Findings:      The scout film was normal. The esophagus was successfully intubated       under direct vision. The scope was advanced to a normal major papilla in       the descending duodenum without detailed examination of the pharynx,       larynx and associated structures,and upper GI tract. The upper GI tract       was grossly normal.      On the first attempt at canulation, the wire was noted to advance in the       direction of the pancreatic duct, was immediately withdrawn and       repositioned.      The bile duct was subsequently, deeply cannulated with the       sphincterotome. Contrast was injected. I personally interpreted the bile       duct images. There was brisk flow of contrast through the ducts. Image       quality was excellent. Contrast extended to the main bile duct.      The lower third of the  main bile duct contained one stone, which was 6       mm in diameter. A straight Roadrunner wire was passed into the biliary       tree.      A 10 mm biliary sphincterotomy was made with a braided sphincterotome       using ERBE electrocautery. There was no post-sphincterotomy bleeding.      The biliary tree was swept with a 9/12 mm balloon starting at the       bifurcation. One stone was removed. No stones remained.      The pancreatic duct was not injected intentionally.      The patient was given rectal diclofenac and IV zosyn at the start of the        procedure. Impression:               - Choledocholithiasis was found. Complete removal                            was accomplished by biliary sphincterotomy and                            balloon extraction.                           - A biliary sphincterotomy was performed.                           - The biliary tree was swept. Moderate Sedation:      Patient did not receive moderate sedation for this procedure, but       instead received monitored anesthesia care. Recommendation:           - Clear liquid diet.NPO post midnight.                           - Laproscopic cholecystectomy planned for tomorrow. Procedure Code(s):        --- Professional ---                           [id number], Endoscopic retrograde                            cholangiopancreatography (ERCP); with removal of                            calculi/debris from biliary/pancreatic duct(s)                           43262, Endoscopic retrograde                            cholangiopancreatography (ERCP); with                            sphincterotomy/papillotomy                           [id number], Endoscopic catheterization of the biliary  ductal system, radiological supervision and                            interpretation Diagnosis Code(s):        --- Professional ---                           K80.50, Calculus of bile duct without cholangitis                            or cholecystitis without obstruction                           R17, Unspecified jaundice                           R74.8, Abnormal levels of other serum enzymes CPT copyright 2019 [employer] Association. All rights reserved. The codes documented in this report are preliminary and upon coder review may  be revised to meet current compliance requirements. Kerin Salen, MD 11/29/2021 1:58:56 PM This report has been signed electronically. Number of Addenda: 0

## 2021-11-29 NOTE — Progress Notes (Signed)
Patient back to room in no distress.

## 2021-11-30 ENCOUNTER — Encounter (HOSPITAL_COMMUNITY): Payer: Self-pay | Admitting: Internal Medicine

## 2021-11-30 ENCOUNTER — Inpatient Hospital Stay (HOSPITAL_COMMUNITY): Payer: BC Managed Care – PPO | Admitting: Anesthesiology

## 2021-11-30 ENCOUNTER — Encounter (HOSPITAL_COMMUNITY)
Admission: EM | Disposition: A | Discharge status: Home / Self Care | Payer: Self-pay | Source: Home / Self Care | Attending: Internal Medicine

## 2021-11-30 DIAGNOSIS — K8050 Calculus of bile duct without cholangitis or cholecystitis without obstruction: Secondary | ICD-10-CM | POA: Diagnosis not present

## 2021-11-30 HISTORY — PX: CHOLECYSTECTOMY: SHX55

## 2021-11-30 SURGERY — LAPAROSCOPIC CHOLECYSTECTOMY WITH INTRAOPERATIVE CHOLANGIOGRAM
Anesthesia: General

## 2021-11-30 MED ORDER — ONDANSETRON HCL 4 MG/2ML IJ SOLN
4.0000 mg | Freq: Once | INTRAMUSCULAR | Status: DC | PRN
Start: 2021-11-30 — End: 2021-11-30

## 2021-11-30 MED ORDER — MIDAZOLAM HCL 2 MG/2ML IJ SOLN
INTRAMUSCULAR | Status: AC
  Filled 2021-11-30: qty 2

## 2021-11-30 MED ORDER — HYDROMORPHONE HCL 1 MG/ML IJ SOLN
0.5000 mg | INTRAMUSCULAR | Status: DC | PRN
  Administered 2021-11-30 – 2021-12-01 (×5): 0.5 mg via INTRAVENOUS
  Filled 2021-11-30 (×5): qty 0.5

## 2021-11-30 MED ORDER — BUPIVACAINE-EPINEPHRINE 0.25% -1:200000 IJ SOLN
INTRAMUSCULAR | Status: DC | PRN
  Administered 2021-11-30: 24 mL

## 2021-11-30 MED ORDER — ROCURONIUM BROMIDE 10 MG/ML (PF) SYRINGE
PREFILLED_SYRINGE | INTRAVENOUS | Status: DC | PRN
  Administered 2021-11-30: 70 mg via INTRAVENOUS

## 2021-11-30 MED ORDER — DEXAMETHASONE SODIUM PHOSPHATE 10 MG/ML IJ SOLN
INTRAMUSCULAR | Status: DC | PRN
  Administered 2021-11-30: 10 mg via INTRAVENOUS

## 2021-11-30 MED ORDER — KETOROLAC TROMETHAMINE 30 MG/ML IJ SOLN
INTRAMUSCULAR | Status: AC
  Filled 2021-11-30: qty 1

## 2021-11-30 MED ORDER — ORAL CARE MOUTH RINSE: 15.0000 mL | Freq: Once | OROMUCOSAL | Status: AC

## 2021-11-30 MED ORDER — OXYCODONE HCL 5 MG PO TABS: 5.0000 mg | ORAL_TABLET | Freq: Once | ORAL | Status: DC | PRN

## 2021-11-30 MED ORDER — SUGAMMADEX SODIUM 200 MG/2ML IV SOLN
INTRAVENOUS | Status: DC | PRN
  Administered 2021-11-30: 200 mg via INTRAVENOUS

## 2021-11-30 MED ORDER — HYDROMORPHONE HCL 1 MG/ML IJ SOLN
INTRAMUSCULAR | Status: AC
  Filled 2021-11-30: qty 1

## 2021-11-30 MED ORDER — ONDANSETRON HCL 4 MG/2ML IJ SOLN
INTRAMUSCULAR | Status: DC | PRN
  Administered 2021-11-30: 4 mg via INTRAVENOUS

## 2021-11-30 MED ORDER — ACETAMINOPHEN 500 MG PO TABS
1000.0000 mg | ORAL_TABLET | Freq: Once | ORAL | Status: AC
  Administered 2021-11-30: 1000 mg via ORAL
  Filled 2021-11-30: qty 2

## 2021-11-30 MED ORDER — CHLORHEXIDINE GLUCONATE 0.12 % MT SOLN
15.0000 mL | Freq: Once | OROMUCOSAL | Status: AC
  Administered 2021-11-30: 15 mL via OROMUCOSAL

## 2021-11-30 MED ORDER — FENTANYL CITRATE (PF) 100 MCG/2ML IJ SOLN
INTRAMUSCULAR | Status: AC
  Filled 2021-11-30: qty 2

## 2021-11-30 MED ORDER — HYDROMORPHONE HCL 1 MG/ML IJ SOLN
0.2500 mg | INTRAMUSCULAR | Status: DC | PRN
  Administered 2021-11-30 (×3): 0.5 mg via INTRAVENOUS

## 2021-11-30 MED ORDER — KETOROLAC TROMETHAMINE 30 MG/ML IJ SOLN
INTRAMUSCULAR | Status: DC | PRN
  Administered 2021-11-30: 30 mg via INTRAVENOUS

## 2021-11-30 MED ORDER — AMISULPRIDE (ANTIEMETIC) 5 MG/2ML IV SOLN: 10.0000 mg | Freq: Once | INTRAVENOUS | Status: DC | PRN

## 2021-11-30 MED ORDER — MIDAZOLAM HCL 5 MG/5ML IJ SOLN
INTRAMUSCULAR | Status: DC | PRN
  Administered 2021-11-30: 2 mg via INTRAVENOUS

## 2021-11-30 MED ORDER — FENTANYL CITRATE (PF) 250 MCG/5ML IJ SOLN
INTRAMUSCULAR | Status: DC | PRN
  Administered 2021-11-30 (×4): 50 ug via INTRAVENOUS
  Administered 2021-11-30: 100 ug via INTRAVENOUS

## 2021-11-30 MED ORDER — OXYCODONE HCL 5 MG PO TABS
5.0000 mg | ORAL_TABLET | ORAL | Status: DC | PRN
  Administered 2021-12-01: 10 mg via ORAL
  Filled 2021-11-30: qty 2

## 2021-11-30 MED ORDER — KETOROLAC TROMETHAMINE 30 MG/ML IJ SOLN
30.0000 mg | Freq: Once | INTRAMUSCULAR | Status: AC | PRN
  Administered 2021-11-30: 30 mg via INTRAVENOUS

## 2021-11-30 MED ORDER — ACETAMINOPHEN 325 MG PO TABS
650.0000 mg | ORAL_TABLET | Freq: Four times a day (QID) | ORAL | Status: DC
Start: 2021-11-30 — End: 2021-12-01
  Administered 2021-11-30 – 2021-12-01 (×4): 650 mg via ORAL
  Filled 2021-11-30 (×4): qty 2

## 2021-11-30 MED ORDER — LACTATED RINGERS IR SOLN
Status: DC | PRN
  Administered 2021-11-30: 1000 mL

## 2021-11-30 MED ORDER — PROPOFOL 10 MG/ML IV BOLUS
INTRAVENOUS | Status: AC
  Filled 2021-11-30: qty 20

## 2021-11-30 MED ORDER — OXYCODONE HCL 5 MG PO TABS
5.0000 mg | ORAL_TABLET | Freq: Four times a day (QID) | ORAL | 0 refills | Status: AC | PRN
Start: 2021-11-30 — End: ?

## 2021-11-30 MED ORDER — ROCURONIUM BROMIDE 10 MG/ML (PF) SYRINGE
PREFILLED_SYRINGE | INTRAVENOUS | Status: AC
  Filled 2021-11-30: qty 10

## 2021-11-30 MED ORDER — LACTATED RINGERS IV SOLN: INTRAVENOUS | Status: DC

## 2021-11-30 MED ORDER — ONDANSETRON HCL 4 MG/2ML IJ SOLN
INTRAMUSCULAR | Status: AC
  Filled 2021-11-30: qty 2

## 2021-11-30 MED ORDER — DEXAMETHASONE SODIUM PHOSPHATE 10 MG/ML IJ SOLN
INTRAMUSCULAR | Status: AC
  Filled 2021-11-30: qty 1

## 2021-11-30 MED ORDER — OXYCODONE HCL 5 MG/5ML PO SOLN: 5.0000 mg | Freq: Once | ORAL | Status: DC | PRN

## 2021-11-30 MED ORDER — PROPOFOL 10 MG/ML IV BOLUS
INTRAVENOUS | Status: DC | PRN
  Administered 2021-11-30: 160 mg via INTRAVENOUS

## 2021-11-30 MED ORDER — FENTANYL CITRATE PF 50 MCG/ML IJ SOSY: 25.0000 ug | PREFILLED_SYRINGE | INTRAMUSCULAR | Status: DC | PRN

## 2021-11-30 MED ORDER — LIDOCAINE HCL (PF) 2 % IJ SOLN
INTRAMUSCULAR | Status: AC
  Filled 2021-11-30: qty 5

## 2021-11-30 MED ORDER — BUPIVACAINE-EPINEPHRINE (PF) 0.25% -1:200000 IJ SOLN
INTRAMUSCULAR | Status: AC
  Filled 2021-11-30: qty 30

## 2021-11-30 SURGICAL SUPPLY — 49 items
APPLIER CLIP ROT 10 11.4 M/L (STAPLE) ×2
BAG RETRIEVAL 10 (BASKET)
BENZOIN TINCTURE PRP APPL 2/3 (GAUZE/BANDAGES/DRESSINGS) ×2 IMPLANT
CABLE HIGH FREQUENCY MONO STRZ (ELECTRODE) ×2 IMPLANT
CHLORAPREP W/TINT 26 (MISCELLANEOUS) ×2 IMPLANT
CLIP APPLIE ROT 10 11.4 M/L (STAPLE) ×1 IMPLANT
CLIP LIGATING HEMO LOK XL GOLD (MISCELLANEOUS) ×1 IMPLANT
COVER MAYO STAND XLG (MISCELLANEOUS) ×2 IMPLANT
COVER SURGICAL LIGHT HANDLE (MISCELLANEOUS) ×2 IMPLANT
DRAPE C-ARM 42X120 X-RAY (DRAPES) ×1 IMPLANT
DRSG IV TEGADERM 3.5X4.5 STRL (GAUZE/BANDAGES/DRESSINGS) ×1 IMPLANT
DRSG TEGADERM 2-3/8X2-3/4 SM (GAUZE/BANDAGES/DRESSINGS) ×6 IMPLANT
DRSG TEGADERM 4X4.75 (GAUZE/BANDAGES/DRESSINGS) ×2 IMPLANT
ELECT REM PT RETURN 15FT ADLT (MISCELLANEOUS) ×2 IMPLANT
ENDOLOOP SUT PDS II  0 18 (SUTURE) ×2
ENDOLOOP SUT PDS II 0 18 (SUTURE) IMPLANT
GAUZE SPONGE 2X2 8PLY STRL LF (GAUZE/BANDAGES/DRESSINGS) IMPLANT
GLOVE BIO SURGEON STRL SZ7 (GLOVE) ×2 IMPLANT
GLOVE BIOGEL PI IND STRL 7.5 (GLOVE) ×1 IMPLANT
GLOVE BIOGEL PI INDICATOR 7.5 (GLOVE) ×1
GOWN STRL REUS W/ TWL LRG LVL3 (GOWN DISPOSABLE) ×1 IMPLANT
GOWN STRL REUS W/TWL LRG LVL3 (GOWN DISPOSABLE) ×1
IRRIG SUCT STRYKERFLOW 2 WTIP (MISCELLANEOUS) ×2
IRRIGATION SUCT STRKRFLW 2 WTP (MISCELLANEOUS) ×1 IMPLANT
KIT BASIN OR (CUSTOM PROCEDURE TRAY) ×2 IMPLANT
KIT TURNOVER KIT A (KITS) IMPLANT
NS IRRIG 1000ML POUR BTL (IV SOLUTION) ×2 IMPLANT
POUCH RETRIEVAL ECOSAC 10 (ENDOMECHANICALS) IMPLANT
POUCH RETRIEVAL ECOSAC 10MM (ENDOMECHANICALS)
PROTECTOR NERVE ULNAR (MISCELLANEOUS) IMPLANT
SCISSORS LAP 5X35 DISP (ENDOMECHANICALS) ×2 IMPLANT
SET CHOLANGIOGRAPH MIX (MISCELLANEOUS) ×1 IMPLANT
SET TUBE SMOKE EVAC HIGH FLOW (TUBING) ×2 IMPLANT
SPIKE FLUID TRANSFER (MISCELLANEOUS) ×1 IMPLANT
SPONGE GAUZE 2X2 STER 10/PKG (GAUZE/BANDAGES/DRESSINGS) ×1
STRIP CLOSURE SKIN 1/2X4 (GAUZE/BANDAGES/DRESSINGS) ×2 IMPLANT
SUT MNCRL AB 4-0 PS2 18 (SUTURE) ×2 IMPLANT
SYR 20ML LL LF (SYRINGE) IMPLANT
SYS BAG RETRIEVAL 10MM (BASKET)
SYSTEM BAG RETRIEVAL 10MM (BASKET) IMPLANT
TAPE CLOTH 4X10 WHT NS (GAUZE/BANDAGES/DRESSINGS) IMPLANT
TOWEL OR 17X26 10 PK STRL BLUE (TOWEL DISPOSABLE) ×2 IMPLANT
TOWEL OR NON WOVEN STRL DISP B (DISPOSABLE) ×2 IMPLANT
TRAY LAPAROSCOPIC (CUSTOM PROCEDURE TRAY) ×2 IMPLANT
TROCAR 11X100 Z THREAD (TROCAR) ×2 IMPLANT
TROCAR BALLN 12MMX100 BLUNT (TROCAR) ×2 IMPLANT
TROCAR XCEL NON-BLD 11X100MML (ENDOMECHANICALS) ×1 IMPLANT
TROCAR Z-THREAD OPTICAL 5X100M (TROCAR) ×4 IMPLANT
WATER STERILE IRR 1000ML POUR (IV SOLUTION) ×2 IMPLANT

## 2021-11-30 NOTE — Transfer of Care (Signed)
Immediate Anesthesia Transfer of Care Note  Patient: Tina Mueller  Procedure(s) Performed: LAPAROSCOPIC CHOLECYSTECTOMY  Patient Location: PACU  Anesthesia Type:General  Level of Consciousness: awake, alert  and oriented  Airway & Oxygen Therapy: Patient Spontanous Breathing and Patient connected to face mask oxygen  Post-op Assessment: Report given to RN and Post -op Vital signs reviewed and stable  Post vital signs: Reviewed and stable  Last Vitals:  Vitals Value Taken Time  BP 126/78 11/30/21 1202  Temp    Pulse 69 11/30/21 1205  Resp 13 11/30/21 1205  SpO2 100 % 11/30/21 1205  Vitals shown include unvalidated device data.  Last Pain:  Vitals:   11/30/21 0915  TempSrc:   PainSc: 0-No pain         Complications: No notable events documented.

## 2021-11-30 NOTE — Op Note (Signed)
Laparoscopic Cholecystectomy Procedure Note  Indications: This patient presents with acute cholecystitis and choledocholithiasis.  She had an ERCP yesterday to extract the common duct stone and will undergo laparoscopic cholecystectomy.  Pre-operative Diagnosis: Calculus of gallbladder with acute cholecystitis, without mention of obstruction  Post-operative Diagnosis: Same  Surgeon: Wynona Luna   Assistants: Trixie Deis, PA-C  Anesthesia: General endotracheal anesthesia  ASA Class: 2  Procedure Details  The patient was seen again in the Holding Room. The risks, benefits, complications, treatment options, and expected outcomes were discussed with the patient. The possibilities of reaction to medication, pulmonary aspiration, perforation of viscus, bleeding, recurrent infection, finding a normal gallbladder, the need for additional procedures, failure to diagnose a condition, the possible need to convert to an open procedure, and creating a complication requiring transfusion or operation were discussed with the patient. The likelihood of improving the patient's symptoms with return to their baseline status is good.  The patient and/or family concurred with the proposed plan, giving informed consent. The site of surgery properly noted. The patient was taken to Operating Room, identified as Tina Mueller and the procedure verified as Laparoscopic Cholecystectomy with Intraoperative Cholangiogram. A Time Out was held and the above information confirmed.  Prior to the induction of general anesthesia, antibiotic prophylaxis was administered. General endotracheal anesthesia was then administered and tolerated well. After the induction, the abdomen was prepped with Chloraprep and draped in sterile fashion. The patient was positioned in the supine position.  Local anesthetic agent was injected into the skin below the umbilicus and an incision made. We dissected down to the abdominal fascia with  blunt dissection.  The fascia was incised vertically and we entered the peritoneal cavity bluntly.  A pursestring suture of 0-Vicryl was placed around the fascial opening.  The Hasson cannula was inserted and secured with the stay suture.  Pneumoperitoneum was then created with CO2 and tolerated well without any adverse changes in the patient's vital signs. An 11-mm port was placed in the subxiphoid position.  Two 5-mm ports were placed in the right upper quadrant. All skin incisions were infiltrated with a local anesthetic agent before making the incision and placing the trocars.   We positioned the patient in reverse Trendelenburg, tilted slightly to the patient's left.  The gallbladder was identified.  The gallbladder was quite thickened and erythematous.  We decompressed the gallbladder with the suction aspirator.  The fundus was grasped and retracted cephalad. Adhesions were lysed bluntly and with the electrocautery where indicated, taking care not to injure any adjacent organs or viscus. The infundibulum was grasped and retracted laterally, exposing the peritoneum overlying the triangle of Calot. This was then divided and exposed in a blunt fashion. The cystic duct was clearly identified and bluntly dissected circumferentially. A critical view of the cystic duct and cystic artery was obtained.  The cystic duct was then ligated with clips and divided. An endoloop was used to secure the duct closure.The cystic artery was, dissected free, ligated with clips and divided as well.   The gallbladder was dissected from the liver bed in retrograde fashion with the electrocautery. The gallbladder was removed and placed in an Eco sac. The liver bed was irrigated and inspected. Hemostasis was achieved with the electrocautery. Copious irrigation was utilized and was repeatedly aspirated until clear.  The gallbladder and Eco sac were then removed through the umbilical port site.  The pursestring suture was used to close  the umbilical fascia.    We again inspected the  right upper quadrant for hemostasis.  Pneumoperitoneum was released as we removed the trocars.  4-0 Monocryl was used to close the skin.   Benzoin, steri-strips, and clean dressings were applied. The patient was then extubated and brought to the recovery room in stable condition. Instrument, sponge, and needle counts were correct at closure and at the conclusion of the case.   Findings: Acute Cholecystitis with Cholelithiasis  Estimated Blood Loss: less than 50 mL         Drains: none         Specimens: Gallbladder           Complications: None; patient tolerated the procedure well.         Disposition: PACU - hemodynamically stable.         Condition: stable  Wilmon Arms. Corliss Skains, MD, [facility] Surgery  General Surgery   11/30/2021 11:51 AM

## 2021-11-30 NOTE — Hospital Course (Addendum)
41 year old female with back pain/thoracic kyphosis presented with increasing back pain, seen in the ED 6/13 treated with IV pain regimen x-rays unrevealing and discharged home but returned to ED 6/12 with skeletal dictators, oriented.  Work-up with T. bili of 2.6 AST 408 ALT 1005 and alk phos of 173.Right upper quadrant ultrasound revealed gallbladder wall thickening and positive Murphy sign.  Common bile duct was 11.3 mm and a stone was noted in the duct.  GI was consulted and the patient was admitted for choledocholithiasis/cholelithiasis transaminitis and acute cholecystitis.  Being managed with IV antibiotics with Zosyn, pain control.6/13-ERCP completed with biliary sphincterotomy, balloon extraction of choledocholithiasis.

## 2021-11-30 NOTE — Interval H&P Note (Signed)
History and Physical Interval Note:  11/30/2021 7:24 AM  Tina Mueller  has presented today for surgery, with the diagnosis of ACUTE CHOLECYSTITIS.  The various methods of treatment have been discussed with the patient and family. After consideration of risks, benefits and other options for treatment, the patient has consented to  Procedure(s): LAPAROSCOPIC CHOLECYSTECTOMY WITH POSSIBLE INTRAOPERATIVE CHOLANGIOGRAM (N/A) as a surgical intervention.  The patient's history has been reviewed, patient examined, no change in status, stable for surgery.  I have reviewed the patient's chart and labs.  Questions were answered to the patient's satisfaction.    Successful ERCP yesterday with CBD stone extraction/ sphincterotomy.  Patient less symptomatic today.  Will plan to proceed with laparoscopic cholecystectomy today.    Wynona Luna

## 2021-11-30 NOTE — Progress Notes (Signed)
PROGRESS NOTE Tina Mueller  JSH:[id number] DOB: 1980/08/21 DOA: 11/28/2021 PCP: Percell Belt, DO   Brief Narrative/Hospital Course: 41 year old female with back pain/thoracic kyphosis presented with increasing back pain, seen in the ED 6/13 treated with IV pain regimen x-rays unrevealing and discharged home but returned to ED 6/12 with skeletal dictators, oriented.  Work-up with T. bili of 2.6 AST 408 ALT 1005 and alk phos of 173.Right upper quadrant ultrasound revealed gallbladder wall thickening and positive Murphy sign.  Common bile duct was 11.3 mm and a stone was noted in the duct.  GI was consulted and the patient was admitted for choledocholithiasis/cholelithiasis transaminitis and acute cholecystitis.  Being managed with IV antibiotics with Zosyn, pain control.6/13-ERCP completed with biliary sphincterotomy, balloon extraction of choledocholithiasis.     Subjective: Seen and examined this morning.  No abdominal pain.  Waiting for cholecystectomy this morning seen by surgery earlier   Assessment and Plan: Principal Problem:   Choledocholithiasis Active Problems:   Cholelithiasis   Elevated LFTs   Acute cholecystitis   Chronic back pain   Choledocholithiasis Cholelithiasis Acute cholecystitis Transaminitis: Underwent ERCP with sphincterotomy, planning for cholecystectomy, discussed with surgery PA postop monitoring overnight.  No need for further IV antibiotics.  Diet as tolerated, ambulate postop.  Chronic back pain/thoracic kyphosis:Followed by chiropractor, PCP as outpatient  Class I Obesity:Patient's Body mass index is 31.81 kg/m. : Will benefit with PCP follow-up, weight loss  healthy lifestyle and outpatient sleep evaluation.   DVT prophylaxis: SCDs Start: 11/28/21 2005 Code Status:   Code Status: Full Code Family Communication: plan of care discussed with patient at bedside. Patient status is: Inpatient because of ongoing postop recovery Level of care: Med-Surg    Dispo: The patient is from: Home            Anticipated disposition: Home tomorrow if remains stable  Mobility Assessment (last 72 hours)     Mobility Assessment     Row Name 11/29/21 1936 11/28/21 1900         Does patient have an order for bedrest or is patient medically unstable No - Continue assessment No - Continue assessment                Objective: Vitals last 24 hrs: Vitals:   11/30/21 1215 11/30/21 1230 11/30/21 1245 11/30/21 1300  BP: 130/90 134/82 119/85 121/78  Pulse: 65 76 68 71  Resp: 14 17 16 16   Temp:      TempSrc:      SpO2: 99% 98% 96% 97%  Weight:      Height:       Weight change: 0 kg  Physical Examination: General exam: alert awake,older than stated age, weak appearing. HEENT:Oral mucosa moist, Ear/Nose WNL grossly, dentition normal. Respiratory system: bilaterally diminished BS, no use of accessory muscle Cardiovascular system: S1 & S2 +, No JVD. Gastrointestinal system: Abdomen soft,NT,ND, BS+ Nervous System:Alert, awake, moving extremities and grossly nonfocal Extremities: LE edema neg,distal peripheral pulses palpable.  Skin: No rashes,no icterus. MSK: Normal muscle bulk,tone, power  Medications reviewed:  Scheduled Meds:  acetaminophen  650 mg Oral Q6H   Continuous Infusions:    Diet Order             Diet clear liquid Room service appropriate? Yes; Fluid consistency: Thin  Diet effective now                            Intake/Output Summary (Last 24 hours)  at 11/30/2021 1429 Last data filed at 11/30/2021 1205 Gross per 24 hour  Intake 1376.4 ml  Output --  Net 1376.4 ml   Net IO Since Admission: 2,516.4 mL [11/30/21 1429]  Wt Readings from Last 3 Encounters:  11/30/21 103.5 kg  11/26/21 102.1 kg  11/01/19 100.2 kg     Unresulted Labs (From admission, onward)    None     Data Reviewed: I have personally reviewed following labs and imaging studies CBC: Recent Labs  Lab 11/28/21 0930 11/29/21 0520   WBC 4.9 3.6*  NEUTROABS 3.1 2.1  HGB 13.9 12.9  HCT 42.9 40.1  MCV 93.9 95.0  PLT 208 885   Basic Metabolic Panel: Recent Labs  Lab 11/28/21 0930 11/29/21 0520  NA 139 141  K 3.5 3.9  CL 103 110  CO2 23 25  GLUCOSE 87 87  BUN 9 12  CREATININE 0.56 0.73  CALCIUM 9.6 8.7*   GFR: Estimated Creatinine Clearance: 123.8 mL/min (by C-G formula based on SCr of 0.73 mg/dL). Liver Function Tests: Recent Labs  Lab 11/28/21 0930 11/29/21 0520  AST 408* 275*  ALT 1,005* 782*  ALKPHOS 173* 172*  BILITOT 2.6* 2.4*  PROT 8.0 6.6  ALBUMIN 4.6 3.6   Recent Labs  Lab 11/28/21 0930  LIPASE 18   No results for input(s): "AMMONIA" in the last 168 hours. Coagulation Profile: No results for input(s): "INR", "PROTIME" in the last 168 hours. BNP (last 3 results) No results for input(s): "PROBNP" in the last 8760 hours. HbA1C: No results for input(s): "HGBA1C" in the last 72 hours. CBG: No results for input(s): "GLUCAP" in the last 168 hours. Lipid Profile: No results for input(s): "CHOL", "HDL", "LDLCALC", "TRIG", "CHOLHDL", "LDLDIRECT" in the last 72 hours. Thyroid Function Tests: No results for input(s): "TSH", "T4TOTAL", "FREET4", "T3FREE", "THYROIDAB" in the last 72 hours. Sepsis Labs: No results for input(s): "PROCALCITON", "LATICACIDVEN" in the last 168 hours.  No results found for this or any previous visit (from the past 240 hour(s)).  Antimicrobials: Anti-infectives (From admission, onward)    Start     Dose/Rate Route Frequency Ordered Stop   11/28/21 2200  piperacillin-tazobactam (ZOSYN) IVPB 3.375 g  Status:  Discontinued        3.375 g 100 mL/hr over 30 Minutes Intravenous Every 8 hours 11/28/21 1930 11/28/21 1942   11/28/21 2200  piperacillin-tazobactam (ZOSYN) IVPB 3.375 g  Status:  Discontinued        3.375 g 12.5 mL/hr over 240 Minutes Intravenous Every 8 hours 11/28/21 1943 11/30/21 1322      Culture/Microbiology No results found for: "SDES",  "SPECREQUEST", "CULT", "REPTSTATUS"   Radiology Studies: DG ERCP  Result Date: 11/29/2021 CLINICAL DATA:  Choledocholithiasis. EXAM: ERCP TECHNIQUE: Multiple spot images obtained with the fluoroscopic device and submitted for interpretation post-procedure. COMPARISON:  Right upper quadrant abdominal ultrasound on 11/28/2021 FINDINGS: Imaging during ERCP with a C-arm demonstrates a dilated common bile duct containing at least one filling defect. Balloon sweep maneuver was performed to extract a calculus. IMPRESSION: Filling defect in the common bile duct consistent with choledocholithiasis. Balloon sweep maneuver was performed to extract a calculus. These images were submitted for radiologic interpretation only. Please see the procedural report for the amount of contrast and the fluoroscopy time utilized. Electronically Signed   By: Aletta Edouard M.D.   On: 11/29/2021 14:27     LOS: 2 days   Antonieta Pert, MD [facility]  11/30/2021, 2:29 PM

## 2021-11-30 NOTE — Anesthesia Preprocedure Evaluation (Signed)
Anesthesia Evaluation  Patient identified by MRN, date of birth, ID band Patient awake    Reviewed: Allergy & Precautions, NPO status , Patient's Chart, lab work & pertinent test results  History of Anesthesia Complications Negative for: history of anesthetic complications  Airway Mallampati: II  TM Distance: >3 FB Neck ROM: Full    Dental  (+) Teeth Intact, Dental Advisory Given   Pulmonary neg pulmonary ROS,    Pulmonary exam normal        Cardiovascular negative cardio ROS Normal cardiovascular exam     Neuro/Psych negative neurological ROS     GI/Hepatic Neg liver ROS, Acute cholecystitis   Endo/Other  negative endocrine ROS  Renal/GU negative Renal ROS  negative genitourinary   Musculoskeletal negative musculoskeletal ROS (+)   Abdominal   Peds  Hematology negative hematology ROS (+)   Anesthesia Other Findings   Reproductive/Obstetrics                             Anesthesia Physical Anesthesia Plan  ASA: 2  Anesthesia Plan: General   Post-op Pain Management: Tylenol PO (pre-op)* and Toradol IV (intra-op)*   Induction: Intravenous  PONV Risk Score and Plan: 4 or greater and Ondansetron, Dexamethasone, Treatment may vary due to age or medical condition and Midazolam  Airway Management Planned: Oral ETT  Additional Equipment: None  Intra-op Plan:   Post-operative Plan: Extubation in OR  Informed Consent: I have reviewed the patients History and Physical, chart, labs and discussed the procedure including the risks, benefits and alternatives for the proposed anesthesia with the patient or authorized representative who has indicated his/her understanding and acceptance.     Dental advisory given  Plan Discussed with:   Anesthesia Plan Comments:         Anesthesia Quick Evaluation

## 2021-11-30 NOTE — Progress Notes (Signed)
Subjective: Patient reports doing well. Tolerated clear liquid diet yesterday without abdominal pain, nausea or vomiting. Scheduled for a cholecystectomy today.  Objective: Vital signs in last 24 hours: Temp:  [97.5 F (36.4 C)-98.9 F (37.2 C)] 97.9 F (36.6 C) (06/14 0548) Pulse Rate:  [70-92] 70 (06/14 0548) Resp:  [12-22] 18 (06/14 0548) BP: (97-139)/(56-83) 97/69 (06/14 0548) SpO2:  [94 %-100 %] 97 % (06/14 0548) Weight:  [102 kg-103.5 kg] 103.5 kg (06/14 0642) Weight change: 0 kg Last BM Date :  (PTA)  PE: Not in distress GENERAL: No pallor, mild icterus  ABDOMEN: Soft, nondistended, nontender EXTREMITIES: No deformity  Lab Results: Results for orders placed or performed during the hospital encounter of 11/28/21 (from the past 48 hour(s))  CBC with Differential     Status: None   Collection Time: 11/28/21  9:30 AM  Result Value Ref Range   WBC 4.9 4.0 - 10.5 K/uL   RBC 4.57 3.87 - 5.11 MIL/uL   Hemoglobin 13.9 12.0 - 15.0 g/dL   HCT 16.1 09.6 - 04.5 %   MCV 93.9 80.0 - 100.0 fL   MCH 30.4 26.0 - 34.0 pg   MCHC 32.4 30.0 - 36.0 g/dL   RDW 40.9 81.1 - 91.4 %   Platelets 208 150 - 400 K/uL   nRBC 0.0 0.0 - 0.2 %   Neutrophils Relative % 64 %   Neutro Abs 3.1 1.7 - 7.7 K/uL   Lymphocytes Relative 23 %   Lymphs Abs 1.1 0.7 - 4.0 K/uL   Monocytes Relative 7 %   Monocytes Absolute 0.4 0.1 - 1.0 K/uL   Eosinophils Relative 5 %   Eosinophils Absolute 0.3 0.0 - 0.5 K/uL   Basophils Relative 1 %   Basophils Absolute 0.1 0.0 - 0.1 K/uL   Immature Granulocytes 0 %   Abs Immature Granulocytes 0.01 0.00 - 0.07 K/uL    Comment: Performed at [facility], [address], [place], [place] [address]  Comprehensive metabolic panel     Status: Abnormal   Collection Time: 11/28/21  9:30 AM  Result Value Ref Range   Sodium 139 135 - 145 mmol/L   Potassium 3.5 3.5 - 5.1 mmol/L   Chloride 103 98 - 111 mmol/L   CO2 23 22 - 32 mmol/L   Glucose, Bld 87 70 - 99  mg/dL    Comment: Glucose reference range applies only to samples taken after fasting for at least 8 hours.   BUN 9 6 - 20 mg/dL   Creatinine, Ser 6.21 0.44 - 1.00 mg/dL   Calcium 9.6 8.9 - 30.8 mg/dL   Total Protein 8.0 6.5 - 8.1 g/dL   Albumin 4.6 3.5 - 5.0 g/dL   AST 657 (H) 15 - 41 U/L   ALT 1,005 (H) 0 - 44 U/L   Alkaline Phosphatase 173 (H) 38 - 126 U/L   Total Bilirubin 2.6 (H) 0.3 - 1.2 mg/dL   GFR, Estimated >84 >69 mL/min    Comment: (NOTE) Calculated using the CKD-EPI Creatinine Equation (2021)    Anion gap 13 5 - 15    Comment: Performed at [facility], [address], [place], [place] [address]  Urinalysis, Routine w reflex microscopic Urine, Clean Catch     Status: Abnormal   Collection Time: 11/28/21  9:30 AM  Result Value Ref Range   Color, Urine YELLOW YELLOW   APPearance CLEAR CLEAR   Specific Gravity, Urine 1.010 1.005 - 1.030   pH 6.5 5.0 -  8.0   Glucose, UA NEGATIVE NEGATIVE mg/dL   Hgb urine dipstick NEGATIVE NEGATIVE   Bilirubin Urine SMALL (A) NEGATIVE   Ketones, ur NEGATIVE NEGATIVE mg/dL   Protein, ur NEGATIVE NEGATIVE mg/dL   Nitrite NEGATIVE NEGATIVE   Leukocytes,Ua TRACE (A) NEGATIVE   RBC / HPF 0-5 0 - 5 RBC/hpf   WBC, UA 0-5 0 - 5 WBC/hpf   Squamous Epithelial / LPF 0-5 0 - 5   Mucus PRESENT     Comment: Performed at [facility], [address] [place], [place] [address]  Pregnancy, urine     Status: None   Collection Time: 11/28/21  9:30 AM  Result Value Ref Range   Preg Test, Ur NEGATIVE NEGATIVE    Comment:        THE SENSITIVITY OF THIS METHODOLOGY IS >20 mIU/mL. Performed at [facility], [address], [place], [place] [address]   Lipase, blood     Status: None   Collection Time: 11/28/21  9:30 AM  Result Value Ref Range   Lipase 18 11 - 51 U/L    Comment: Performed at [facility], [address], [place], [place] [address]  Bilirubin, direct      Status: Abnormal   Collection Time: 11/28/21  9:30 AM  Result Value Ref Range   Bilirubin, Direct 1.4 (H) 0.0 - 0.2 mg/dL    Comment: Performed at [facility], [address], [place], [place] [address]  Hepatitis panel, acute     Status: None   Collection Time: 11/28/21 11:35 AM  Result Value Ref Range   Hepatitis B Surface Ag NON REACTIVE NON REACTIVE   HCV Ab NON REACTIVE NON REACTIVE    Comment: (NOTE) Nonreactive HCV antibody screen is consistent with no HCV infections,  unless recent infection is suspected or other evidence exists to indicate HCV infection.     Hep A IgM NON REACTIVE NON REACTIVE   Hep B C IgM NON REACTIVE NON REACTIVE    Comment: Performed at [facility], [address] [place], [place] [address]  Comprehensive metabolic panel     Status: Abnormal   Collection Time: 11/29/21  5:20 AM  Result Value Ref Range   Sodium 141 135 - 145 mmol/L   Potassium 3.9 3.5 - 5.1 mmol/L   Chloride 110 98 - 111 mmol/L   CO2 25 22 - 32 mmol/L   Glucose, Bld 87 70 - 99 mg/dL    Comment: Glucose reference range applies only to samples taken after fasting for at least 8 hours.   BUN 12 6 - 20 mg/dL   Creatinine, Ser 7.04 0.44 - 1.00 mg/dL   Calcium 8.7 (L) 8.9 - 10.3 mg/dL   Total Protein 6.6 6.5 - 8.1 g/dL   Albumin 3.6 3.5 - 5.0 g/dL   AST 888 (H) 15 - 41 U/L   ALT 782 (H) 0 - 44 U/L    Comment: RESULTS CONFIRMED BY MANUAL DILUTION   Alkaline Phosphatase 172 (H) 38 - 126 U/L   Total Bilirubin 2.4 (H) 0.3 - 1.2 mg/dL   GFR, Estimated >91 >69 mL/min    Comment: (NOTE) Calculated using the CKD-EPI Creatinine Equation (2021)    Anion gap 6 5 - 15    Comment: Performed at [facility], [address], [place], [place] [address]  CBC with Differential/Platelet     Status: Abnormal   Collection Time: 11/29/21  5:20 AM  Result Value Ref  Range   WBC 3.6 (L) 4.0 - 10.5 K/uL   RBC 4.22 3.87 - 5.11 MIL/uL   Hemoglobin 12.9 12.0 -  15.0 g/dL   HCT 16.140.1 09.636.0 - 04.546.0 %   MCV 95.0 80.0 - 100.0 fL   MCH 30.6 26.0 - 34.0 pg   MCHC 32.2 30.0 - 36.0 g/dL   RDW 40.912.9 81.111.5 - 91.415.5 %   Platelets 189 150 - 400 K/uL   nRBC 0.0 0.0 - 0.2 %   Neutrophils Relative % 58 %   Neutro Abs 2.1 1.7 - 7.7 K/uL   Lymphocytes Relative 28 %   Lymphs Abs 1.0 0.7 - 4.0 K/uL   Monocytes Relative 7 %   Monocytes Absolute 0.2 0.1 - 1.0 K/uL   Eosinophils Relative 6 %   Eosinophils Absolute 0.2 0.0 - 0.5 K/uL   Basophils Relative 1 %   Basophils Absolute 0.0 0.0 - 0.1 K/uL   Immature Granulocytes 0 %   Abs Immature Granulocytes 0.01 0.00 - 0.07 K/uL    Comment: Performed at [facility], [address] [place], [place] [address]  HIV Antibody (routine testing w rflx)     Status: None   Collection Time: 11/29/21  5:20 AM  Result Value Ref Range   HIV Screen 4th Generation wRfx Non Reactive Non Reactive    Comment: Performed at [facility], [address] [place], [place] [address]    Studies/Results: DG ERCP  Result Date: 11/29/2021 CLINICAL DATA:  Choledocholithiasis. EXAM: ERCP TECHNIQUE: Multiple spot images obtained with the fluoroscopic device and submitted for interpretation post-procedure. COMPARISON:  Right upper quadrant abdominal ultrasound on 11/28/2021 FINDINGS: Imaging during ERCP with a C-arm demonstrates a dilated common bile duct containing at least one filling defect. Balloon sweep maneuver was performed to extract a calculus. IMPRESSION: Filling defect in the common bile duct consistent with choledocholithiasis. Balloon sweep maneuver was performed to extract a calculus. These images were submitted for radiologic interpretation only. Please see the procedural report for the amount of contrast and the fluoroscopy time utilized. Electronically Signed   By: Irish LackGlenn  Yamagata M.D.   On: 11/29/2021 14:27   US Abdomen Limited RUQ (LIVER/GB)  Addendum Date: 11/29/2021   ADDENDUM REPORT: 11/29/2021 07:38  ADDENDUM: I was asked review this case. No common duct stone is demonstrated on this study. The common bile duct is, however, dilated and a distal stone is not excluded. In reviewing the clinical notes, the plan is for ERCP prior to cholecystectomy. Electronically Signed   By: Paulina FusiMark  Shogry M.D.   On: 11/29/2021 07:38   Result Date: 11/29/2021 CLINICAL DATA:  Elevated LFTs EXAM: ULTRASOUND ABDOMEN LIMITED RIGHT UPPER QUADRANT COMPARISON:  None Available. FINDINGS: Gallbladder: Gallbladder wall thickening. Masslike thickening of the gallbladder fundus with associated calcifications. Positive Murphy sign noted by sonographer. Common bile duct: Diameter: 11.3 mm A stone is seen in the common bile duct. Liver: No focal lesion identified. Within normal limits in parenchymal echogenicity. Portal vein is patent on color Doppler imaging with normal direction of blood flow towards the liver. Other: None. IMPRESSION: 1. Gallbladder wall thickening with positive sonographic Murphy sign, findings can be seen in the setting of acute cholecystitis. 2. Dilated common bile duct with common bile duct stone visualized, findings are compatible with choledocholithiasis. 3. Masslike thickening of the gallbladder fundus with associated calcifications, differential considerations include localized adenomyomatosis, non mobile tumefactive sludge, or gallbladder mass. MRI with contrast could be performed for further evaluation. Electronically Signed:  By: Allegra Lai M.D. On: 11/28/2021 11:20    Medications: I have reviewed the patient's current medications.  Assessment: Choledocholithiasis-status post ERCP with sphincterotomy, balloon sweep and CBD stone extraction Acute cholecystitis  Plan: Cholecystectomy today. Further management as per diet advancement and discharge planning as per surgical team and primary team. GI will sign off, please recall if needed.  Kerin Salen, MD 11/30/2021, 8:39 AM

## 2021-11-30 NOTE — Anesthesia Postprocedure Evaluation (Signed)
Anesthesia Post Note  Patient: Tina Mueller  Procedure(s) Performed: LAPAROSCOPIC CHOLECYSTECTOMY     Patient location during evaluation: PACU Anesthesia Type: General Level of consciousness: awake and alert Pain management: pain level controlled Vital Signs Assessment: post-procedure vital signs reviewed and stable Respiratory status: spontaneous breathing, nonlabored ventilation and respiratory function stable Cardiovascular status: blood pressure returned to baseline and stable Postop Assessment: no apparent nausea or vomiting Anesthetic complications: no   No notable events documented.  Last Vitals:  Vitals:   11/30/21 1300 11/30/21 1532  BP: 121/78 126/88  Pulse: 71 67  Resp: 16 17  Temp:  (!) 36.3 C  SpO2: 97% 98%    Last Pain:  Vitals:   11/30/21 1532  TempSrc: Oral  PainSc:                  Lucretia Kern

## 2021-11-30 NOTE — Progress Notes (Signed)
  Transition of Care [facility]) Screening Note   Patient Details  Name: Tina Mueller Date of Birth: [birth year]   [facility] CM/SW Contact:    Kimber Esterly, Meriam Sprague, RN Phone Number: 11/30/2021, 1:30 PM    Transition of Care Department [facility]) has reviewed patient and no TOC needs have been identified at this time. We will continue to monitor patient advancement through interdisciplinary progression rounds. If new patient transition needs arise, please place a TOC consult.

## 2021-11-30 NOTE — Anesthesia Postprocedure Evaluation (Signed)
Anesthesia Post Note  Patient: Tina Mueller  Procedure(s) Performed: ENDOSCOPIC RETROGRADE CHOLANGIOPANCREATOGRAPHY (ERCP) WITH PROPOFOL SPHINCTEROTOMY REMOVAL OF STONES     Patient location during evaluation: PACU Anesthesia Type: General Level of consciousness: awake and alert Pain management: pain level controlled Vital Signs Assessment: post-procedure vital signs reviewed and stable Respiratory status: spontaneous breathing, nonlabored ventilation, respiratory function stable and patient connected to nasal cannula oxygen Cardiovascular status: blood pressure returned to baseline and stable Postop Assessment: no apparent nausea or vomiting Anesthetic complications: no   No notable events documented.  Last Vitals:  Vitals:   11/30/21 0548 11/30/21 0911  BP: 97/69 121/85  Pulse: 70 70  Resp: 18 18  Temp: 36.6 C 36.8 C  SpO2: 97% 99%    Last Pain:  Vitals:   11/30/21 0915  TempSrc:   PainSc: 0-No pain                 Omarian Jaquith S

## 2021-11-30 NOTE — Anesthesia Procedure Notes (Signed)
Procedure Name: Intubation Date/Time: 11/30/2021 10:48 AM  Performed by: Lavelle Berland D, CRNAPre-anesthesia Checklist: Patient identified, Emergency Drugs available, Suction available and Patient being monitored Patient Re-evaluated:Patient Re-evaluated prior to induction Oxygen Delivery Method: Circle system utilized Preoxygenation: Pre-oxygenation with 100% oxygen Induction Type: IV induction Ventilation: Mask ventilation without difficulty Laryngoscope Size: Mac and 4 Grade View: Grade I Tube type: Oral Tube size: 7.0 mm Number of attempts: 1 Airway Equipment and Method: Stylet and Oral airway Placement Confirmation: ETT inserted through vocal cords under direct vision, positive ETCO2 and breath sounds checked- equal and bilateral Secured at: 21 cm Tube secured with: Tape Dental Injury: Teeth and Oropharynx as per pre-operative assessment

## 2021-12-01 ENCOUNTER — Encounter (HOSPITAL_COMMUNITY): Payer: Self-pay | Admitting: Surgery

## 2021-12-01 DIAGNOSIS — K8050 Calculus of bile duct without cholangitis or cholecystitis without obstruction: Secondary | ICD-10-CM | POA: Diagnosis not present

## 2021-12-01 LAB — SURGICAL PATHOLOGY

## 2021-12-01 LAB — COMPREHENSIVE METABOLIC PANEL
ALT: 465 U/L — ABNORMAL HIGH (ref 0–44)
AST: 115 U/L — ABNORMAL HIGH (ref 15–41)
Albumin: 3.7 g/dL (ref 3.5–5.0)
Alkaline Phosphatase: 144 U/L — ABNORMAL HIGH (ref 38–126)
Anion gap: 6 (ref 5–15)
BUN: 9 mg/dL (ref 6–20)
CO2: 27 mmol/L (ref 22–32)
Calcium: 8.9 mg/dL (ref 8.9–10.3)
Chloride: 107 mmol/L (ref 98–111)
Creatinine, Ser: 0.61 mg/dL (ref 0.44–1.00)
GFR, Estimated: >60 mL/min (ref >60)
Glucose, Bld: 107 mg/dL — ABNORMAL HIGH (ref 70–99)
Potassium: 4.4 mmol/L (ref 3.5–5.1)
Sodium: 140 mmol/L (ref 135–145)
Total Bilirubin: 1 mg/dL (ref 0.3–1.2)
Total Protein: 7 g/dL (ref 6.5–8.1)

## 2021-12-01 MED ORDER — DOCUSATE SODIUM 100 MG PO CAPS
100.0000 mg | ORAL_CAPSULE | Freq: Two times a day (BID) | ORAL | Status: DC
  Administered 2021-12-01: 100 mg via ORAL
  Filled 2021-12-01: qty 1

## 2021-12-01 MED ORDER — SIMETHICONE 80 MG PO CHEW
80.0000 mg | CHEWABLE_TABLET | Freq: Four times a day (QID) | ORAL | Status: DC
  Administered 2021-12-01: 80 mg via ORAL
  Filled 2021-12-01: qty 1

## 2021-12-01 MED ORDER — KETOROLAC TROMETHAMINE 15 MG/ML IJ SOLN
15.0000 mg | Freq: Once | INTRAMUSCULAR | Status: AC
  Administered 2021-12-01: 15 mg via INTRAVENOUS
  Filled 2021-12-01: qty 1

## 2021-12-01 NOTE — Discharge Summary (Signed)
Physician Discharge Summary  Tina Mueller QIO:[id number] DOB: 09/04/1980 DOA: 11/28/2021  PCP: Percell Belt, DO  Admit date: 11/28/2021 Discharge date: 12/01/2021 Recommendations for Outpatient Follow-up:  Follow up with PCP in 1 weeks-call for appointment Please obtain BMP/CBC in one week  Discharge Dispo: Home Discharge Condition: Stable Code Status:   Code Status: Full Code Diet recommendation:  Diet Order             Diet regular Room service appropriate? Yes; Fluid consistency: Thin  Diet effective now           Diet general                    Brief/Interim Summary: 41 year old female with back pain/thoracic kyphosis presented with increasing back pain, seen in the ED 6/13 treated with IV pain regimen x-rays unrevealing and discharged home but returned to ED 6/12 with skeletal dictators, oriented.  Work-up with T. bili of 2.6 AST 408 ALT 1005 and alk phos of 173.Right upper quadrant ultrasound revealed gallbladder wall thickening and positive Murphy sign.  Common bile duct was 11.3 mm and a stone was noted in the duct.  GI was consulted and the patient was admitted for choledocholithiasis/cholelithiasis transaminitis and acute cholecystitis.  Being managed with IV antibiotics with Zosyn, pain control.6/13-ERCP completed with biliary sphincterotomy, balloon extraction of choledocholithiasis.   Underwent laparoscopic cholecystectomy without disease monitor additional day at this time patient feels stable improved tolerating diet and is being discharged home.  Cleared by general surgery.  She will follow-up with surgery as outpatient  Discharge Diagnoses:  Principal Problem:   Choledocholithiasis Active Problems:   Cholelithiasis   Elevated LFTs   Acute cholecystitis   Chronic back pain  Choledocholithiasis Cholelithiasis Acute cholecystitis Transaminitis: Underwent ERCP with sphincterotomy,s/p cholecystectomy, discussed with surgery team-okay for home today continue  regular diet continue oral pain medication.  Follow-up with surgery as outpatient   Chronic back pain/thoracic kyphosis:Followed by chiropractor, PCP as outpatient   Class I Obesity:Patient's Body mass index is 31.81 kg/m. : Will benefit with PCP follow-up, weight loss  healthy lifestyle and outpatient sleep evaluation.  Consults: GI, Surgery Subjective: Alert awake oriented resting comfortably had some pain issues last night, this morning doing well.  Discharge Exam: Vitals:   11/30/21 2211 12/01/21 0532  BP: 122/84 116/74  Pulse: 68 72  Resp: (!) 22 18  Temp: 97.7 F (36.5 C) (!) 97.5 F (36.4 C)  SpO2: 100% 97%   General: Pt is alert, awake, not in acute distress Cardiovascular: RRR, S1/S2 +, no rubs, no gallops Respiratory: CTA bilaterally, no wheezing, no rhonchi Abdominal: Soft, NT, ND, bowel sounds + Extremities: no edema, no cyanosis  Discharge Instructions  Discharge Instructions     Diet general   Complete by: As directed    Discharge instructions   Complete by: As directed    Please call call MD or return to ER for similar or worsening recurring problem that brought you to hospital or if any fever,nausea/vomiting,abdominal pain, uncontrolled pain, chest pain,  shortness of breath or any other alarming symptoms.  Please follow-up your doctor as instructed in a week time and call the office for appointment.  Please avoid alcohol, smoking, or any other illicit substance and maintain healthy habits including taking your regular medications as prescribed.  You were cared for by a hospitalist during your hospital stay. If you have any questions about your discharge medications or the care you received while you were in the hospital  after you are discharged, you can call the unit and ask to speak with the hospitalist on call if the hospitalist that took care of you is not available.  Once you are discharged, your primary care physician will handle any further medical  issues. Please note that NO REFILLS for any discharge medications will be authorized once you are discharged, as it is imperative that you return to your primary care physician (or establish a relationship with a primary care physician if you do not have one) for your aftercare needs so that they can reassess your need for medications and monitor your lab values   Increase activity slowly   Complete by: As directed    Fu with surgery team as instructed  Please call call MD or return to ER for similar or worsening recurring problem that brought you to hospital or if any fever,nausea/vomiting,abdominal pain, uncontrolled pain, chest pain,  shortness of breath or any other alarming symptoms.  Please follow-up your doctor as instructed in a week time and call the office for appointment.  Please avoid alcohol, smoking, or any other illicit substance and maintain healthy habits including taking your regular medications as prescribed.  You were cared for by a hospitalist during your hospital stay. If you have any questions about your discharge medications or the care you received while you were in the hospital after you are discharged, you can call the unit and ask to speak with the hospitalist on call if the hospitalist that took care of you is not available.  Once you are discharged, your primary care physician will handle any further medical issues. Please note that NO REFILLS for any discharge medications will be authorized once you are discharged, as it is imperative that you return to your primary care physician (or establish a relationship with a primary care physician if you do not have one) for your aftercare needs so that they can reassess your need for medications and monitor your lab values      Allergies as of 12/01/2021   No Known Allergies      Medication List     STOP taking these medications    HYDROcodone-acetaminophen 5-325 MG tablet Commonly known as: Norco       TAKE  these medications    acetaminophen 500 MG tablet Commonly known as: TYLENOL Take 500 mg by mouth every 6 (six) hours as needed for moderate pain.   levonorgestrel 20 MCG/24HR IUD Commonly known as: MIRENA 1 each by Intrauterine route once.   MULTIVITAMIN ADULT PO Take 1 tablet by mouth daily.   oxyCODONE 5 MG immediate release tablet Commonly known as: Oxy IR/ROXICODONE Take 1 tablet (5 mg total) by mouth every 6 (six) hours as needed for moderate pain or severe pain.        Follow-up Information     [facility]. Go on 12/27/2021.   Specialty: General Surgery Why: 10:00 AM with Malachi Pro, PA-C. Please arrive 30 min prior to appointment time. Have ID and insurance information with you. Contact information: [address] [place] [address] [phone]         Lazoff, Shawn P, DO Follow up in 1 week(s).   Specialty: Family Medicine Contact information: [address] [place] [address] [phone]                No Known Allergies  The results of significant diagnostics from this hospitalization (including imaging, microbiology, ancillary and laboratory) are listed  below for reference.    Microbiology: No results found for this or any previous visit (from the past 240 hour(s)).  Procedures/Studies: DG ERCP  Result Date: 11/29/2021 CLINICAL DATA:  Choledocholithiasis. EXAM: ERCP TECHNIQUE: Multiple spot images obtained with the fluoroscopic device and submitted for interpretation post-procedure. COMPARISON:  Right upper quadrant abdominal ultrasound on 11/28/2021 FINDINGS: Imaging during ERCP with a C-arm demonstrates a dilated common bile duct containing at least one filling defect. Balloon sweep maneuver was performed to extract a calculus. IMPRESSION: Filling defect in the common bile duct consistent with choledocholithiasis. Balloon sweep maneuver was performed to extract a calculus. These images were submitted for  radiologic interpretation only. Please see the procedural report for the amount of contrast and the fluoroscopy time utilized. Electronically Signed   By: Aletta Edouard M.D.   On: 11/29/2021 14:27   US Abdomen Limited RUQ (LIVER/GB)  Addendum Date: 11/29/2021   ADDENDUM REPORT: 11/29/2021 07:38 ADDENDUM: I was asked review this case. No common duct stone is demonstrated on this study. The common bile duct is, however, dilated and a distal stone is not excluded. In reviewing the clinical notes, the plan is for ERCP prior to cholecystectomy. Electronically Signed   By: Nelson Chimes M.D.   On: 11/29/2021 07:38   Result Date: 11/29/2021 CLINICAL DATA:  Elevated LFTs EXAM: ULTRASOUND ABDOMEN LIMITED RIGHT UPPER QUADRANT COMPARISON:  None Available. FINDINGS: Gallbladder: Gallbladder wall thickening. Masslike thickening of the gallbladder fundus with associated calcifications. Positive Murphy sign noted by sonographer. Common bile duct: Diameter: 11.3 mm A stone is seen in the common bile duct. Liver: No focal lesion identified. Within normal limits in parenchymal echogenicity. Portal vein is patent on color Doppler imaging with normal direction of blood flow towards the liver. Other: None. IMPRESSION: 1. Gallbladder wall thickening with positive sonographic Murphy sign, findings can be seen in the setting of acute cholecystitis. 2. Dilated common bile duct with common bile duct stone visualized, findings are compatible with choledocholithiasis. 3. Masslike thickening of the gallbladder fundus with associated calcifications, differential considerations include localized adenomyomatosis, non mobile tumefactive sludge, or gallbladder mass. MRI with contrast could be performed for further evaluation. Electronically Signed: By: Yetta Glassman M.D. On: 11/28/2021 11:20   DG Thoracic Spine 2 View  Result Date: 11/26/2021 CLINICAL DATA:  Pain, no known injury. EXAM: THORACIC SPINE 2 VIEWS COMPARISON:  None  Available. FINDINGS: There is no evidence of thoracic spine fracture. Alignment is normal. There is kyphosis of the thoracic spine. Mild intervertebral disc space narrowing and degenerative endplate changes are noted in the mid to lower thoracic spine. IMPRESSION: 1. No acute fracture. 2. Kyphosis with mild degenerative changes in the mid to lower thoracic spine. Electronically Signed   By: Brett Fairy M.D.   On: 11/26/2021 04:03    Labs: BNP (last 3 results) No results for input(s): "BNP" in the last 8760 hours. Basic Metabolic Panel: Recent Labs  Lab 11/28/21 0930 11/29/21 0520 12/01/21 0627  NA 139 141 140  K 3.5 3.9 4.4  CL 103 110 107  CO2 23 25 27   GLUCOSE 87 87 107*  BUN 9 12 9   CREATININE 0.56 0.73 0.61  CALCIUM 9.6 8.7* 8.9   Liver Function Tests: Recent Labs  Lab 11/28/21 0930 11/29/21 0520 12/01/21 0627  AST 408* 275* 115*  ALT 1,005* 782* 465*  ALKPHOS 173* 172* 144*  BILITOT 2.6* 2.4* 1.0  PROT 8.0 6.6 7.0  ALBUMIN 4.6 3.6 3.7   Recent Labs  Lab  11/28/21 0930  LIPASE 18   No results for input(s): "AMMONIA" in the last 168 hours. CBC: Recent Labs  Lab 11/28/21 0930 11/29/21 0520  WBC 4.9 3.6*  NEUTROABS 3.1 2.1  HGB 13.9 12.9  HCT 42.9 40.1  MCV 93.9 95.0  PLT 208 189   Cardiac Enzymes: No results for input(s): "CKTOTAL", "CKMB", "CKMBINDEX", "TROPONINI" in the last 168 hours. BNP: Invalid input(s): "POCBNP" CBG: No results for input(s): "GLUCAP" in the last 168 hours. D-Dimer No results for input(s): "DDIMER" in the last 72 hours. Hgb A1c No results for input(s): "HGBA1C" in the last 72 hours. Lipid Profile No results for input(s): "CHOL", "HDL", "LDLCALC", "TRIG", "CHOLHDL", "LDLDIRECT" in the last 72 hours. Thyroid function studies No results for input(s): "TSH", "T4TOTAL", "T3FREE", "THYROIDAB" in the last 72 hours.  Invalid input(s): "FREET3" Anemia work up No results for input(s): "VITAMINB12", "FOLATE", "FERRITIN", "TIBC",  "IRON", "RETICCTPCT" in the last 72 hours. Urinalysis    Component Value Date/Time   COLORURINE YELLOW 11/28/2021 0930   APPEARANCEUR CLEAR 11/28/2021 0930   LABSPEC 1.010 11/28/2021 0930   PHURINE 6.5 11/28/2021 0930   GLUCOSEU NEGATIVE 11/28/2021 0930   HGBUR NEGATIVE 11/28/2021 0930   BILIRUBINUR SMALL (A) 11/28/2021 0930   KETONESUR NEGATIVE 11/28/2021 0930   PROTEINUR NEGATIVE 11/28/2021 0930   NITRITE NEGATIVE 11/28/2021 0930   LEUKOCYTESUR TRACE (A) 11/28/2021 0930   Sepsis Labs Recent Labs  Lab 11/28/21 0930 11/29/21 0520  WBC 4.9 3.6*   Microbiology No results found for this or any previous visit (from the past 240 hour(s)).   Time coordinating discharge: 25 minutes  SIGNED: Antonieta Pert, MD  [facility] 12/01/2021, 9:30 AM  If 7PM-7AM, please contact night-coverage [email or web address]

## 2021-12-01 NOTE — Progress Notes (Signed)
I went over patients AVS discharge instructions and answered any additional questions. Patient had no more questions upon leaving.

## 2021-12-01 NOTE — Progress Notes (Signed)
Progress Note  1 Day Post-Op  Subjective: Abdomen is sore, some right shoulder discomfort as well. No flatus yet but walking. No nausea with liquids.   Objective: Vital signs in last 24 hours: Temp:  [97.3 F (36.3 C)-99.1 F (37.3 C)] 97.5 F (36.4 C) (06/15 0532) Pulse Rate:  [65-79] 72 (06/15 0532) Resp:  [14-22] 18 (06/15 0532) BP: (116-134)/(74-90) 116/74 (06/15 0532) SpO2:  [96 %-100 %] 97 % (06/15 0532) Weight:  [104.6 kg] 104.6 kg (06/15 0532) Last BM Date :  (PTA)  Intake/Output from previous day: 06/14 0701 - 06/15 0700 In: 1080 [P.O.:480; I.V.:600] Out: 300 [Urine:300] Intake/Output this shift: No intake/output data recorded.  PE: General: pleasant, WD, WN female who is laying in bed in NAD HEENT: sclera anicteric Heart: regular, rate, and rhythm.   Lungs: Respiratory effort nonlabored Abd: soft, appropriately ttp, ND, +BS, incisions C/D/I Psych: A&Ox3 with an appropriate affect.    Lab Results:  Recent Labs    11/29/21 0520  WBC 3.6*  HGB 12.9  HCT 40.1  PLT 189   BMET Recent Labs    11/29/21 0520 12/01/21 0627  NA 141 140  K 3.9 4.4  CL 110 107  CO2 25 27  GLUCOSE 87 107*  BUN 12 9  CREATININE 0.73 0.61  CALCIUM 8.7* 8.9   PT/INR No results for input(s): "LABPROT", "INR" in the last 72 hours. CMP     Component Value Date/Time   NA 140 12/01/2021 0627   K 4.4 12/01/2021 0627   CL 107 12/01/2021 0627   CO2 27 12/01/2021 0627   GLUCOSE 107 (H) 12/01/2021 0627   BUN 9 12/01/2021 0627   CREATININE 0.61 12/01/2021 0627   CALCIUM 8.9 12/01/2021 0627   PROT 7.0 12/01/2021 0627   ALBUMIN 3.7 12/01/2021 0627   AST 115 (H) 12/01/2021 0627   ALT 465 (H) 12/01/2021 0627   ALKPHOS 144 (H) 12/01/2021 0627   BILITOT 1.0 12/01/2021 0627   GFRNONAA >60 12/01/2021 0627   GFRAA >90 08/03/2014 2225   Lipase     Component Value Date/Time   LIPASE 18 11/28/2021 0930       Studies/Results: DG ERCP  Result Date: 11/29/2021 CLINICAL  DATA:  Choledocholithiasis. EXAM: ERCP TECHNIQUE: Multiple spot images obtained with the fluoroscopic device and submitted for interpretation post-procedure. COMPARISON:  Right upper quadrant abdominal ultrasound on 11/28/2021 FINDINGS: Imaging during ERCP with a C-arm demonstrates a dilated common bile duct containing at least one filling defect. Balloon sweep maneuver was performed to extract a calculus. IMPRESSION: Filling defect in the common bile duct consistent with choledocholithiasis. Balloon sweep maneuver was performed to extract a calculus. These images were submitted for radiologic interpretation only. Please see the procedural report for the amount of contrast and the fluoroscopy time utilized. Electronically Signed   By: Irish Lack M.D.   On: 11/29/2021 14:27    Anti-infectives: Anti-infectives (From admission, onward)    Start     Dose/Rate Route Frequency Ordered Stop   11/28/21 2200  piperacillin-tazobactam (ZOSYN) IVPB 3.375 g  Status:  Discontinued        3.375 g 100 mL/hr over 30 Minutes Intravenous Every 8 hours 11/28/21 1930 11/28/21 1942   11/28/21 2200  piperacillin-tazobactam (ZOSYN) IVPB 3.375 g  Status:  Discontinued        3.375 g 12.5 mL/hr over 240 Minutes Intravenous Every 8 hours 11/28/21 1943 11/30/21 1322        Assessment/Plan Choledocholithiasis with acute cholecystitis  POD1  s/p laparoscopic cholecystectomy  - s/p ERCP 6/13 - ok to DC from a surgical standpoint - follow up in AVS and discussed post-op instructions with patient  - no further abx needed from a surgical standpoint   FEN: reg diet, SLIV VTE: SCDs ID: [email or web address] 6/12>6/14  LOS: 3 days      Juliet Rude, [facility] Surgery 12/01/2021, 9:52 AM Please see Amion for pager number during day hours 7:00am-4:30pm

## 2021-12-04 ENCOUNTER — Encounter (HOSPITAL_COMMUNITY): Payer: Self-pay | Admitting: Gastroenterology

## 2021-12-19 ENCOUNTER — Other Ambulatory Visit: Payer: Self-pay | Admitting: Obstetrics and Gynecology

## 2021-12-19 DIAGNOSIS — Z1239 Encounter for other screening for malignant neoplasm of breast: Secondary | ICD-10-CM

## 2022-05-24 ENCOUNTER — Ambulatory Visit
Admission: RE | Admit: 2022-05-24 | Discharge: 2022-05-24 | Disposition: A | Discharge status: Home / Self Care | Payer: BC Managed Care – PPO | Source: Ambulatory Visit | Attending: Obstetrics and Gynecology | Admitting: Obstetrics and Gynecology

## 2022-05-24 DIAGNOSIS — Z1239 Encounter for other screening for malignant neoplasm of breast: Secondary | ICD-10-CM

## 2022-05-24 MED ORDER — GADOPICLENOL 0.5 MMOL/ML IV SOLN
10.0000 mL | Freq: Once | INTRAVENOUS | Status: AC | PRN
  Administered 2022-05-24: 10 mL via INTRAVENOUS

## 2022-10-30 IMAGING — US US ABDOMEN LIMITED
1 series · 13 of 25 positions shown · non-contrast
Comparison: None Available.
COMPARISON: None Available.

Addendum:
CLINICAL DATA: Elevated LFTs

EXAM:
ULTRASOUND ABDOMEN LIMITED RIGHT UPPER QUADRANT

[Series 1: us abdomen limited ruq (liver/gb) · 13 of 58 slices shown]
[im 1/58]
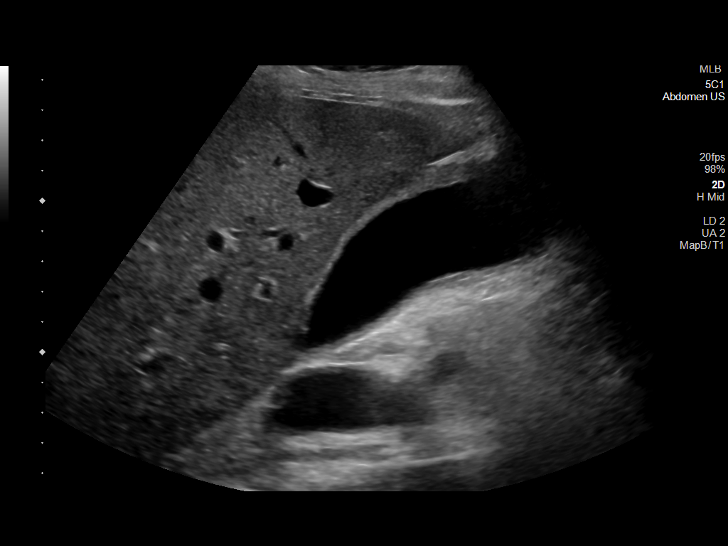
[im 5/58]
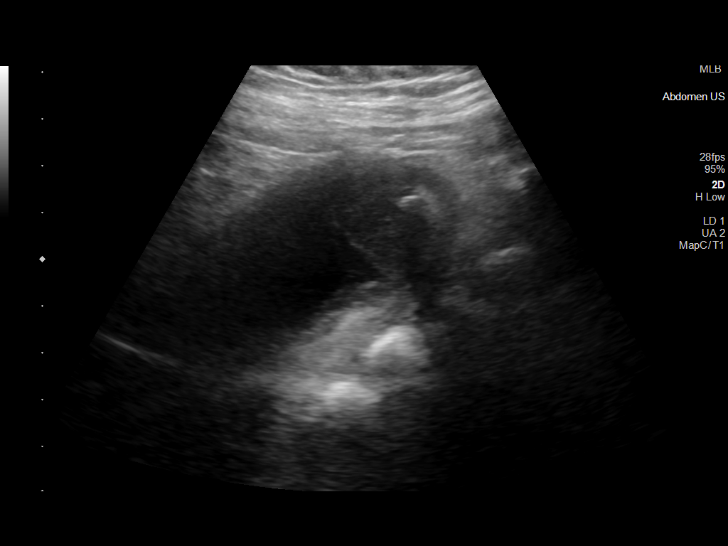
[im 10/58]
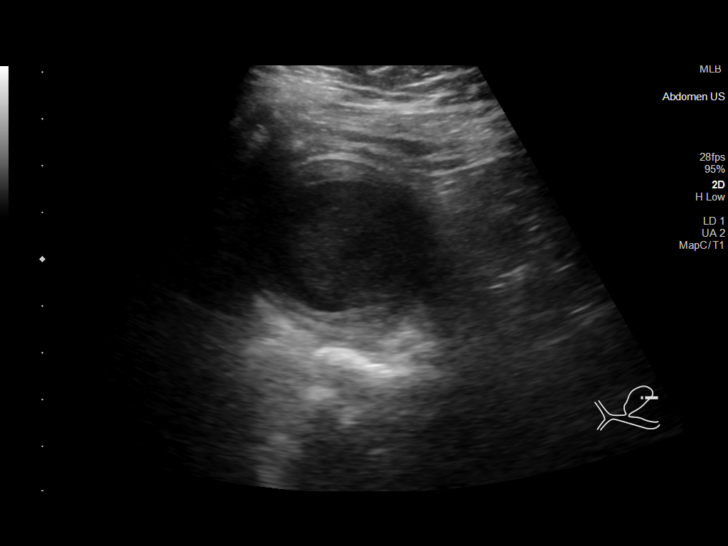
[im 15/58]
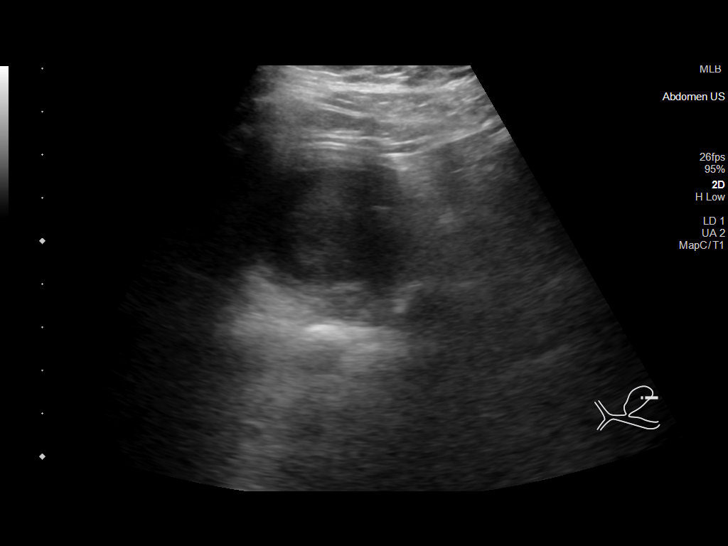
[im 20/58]
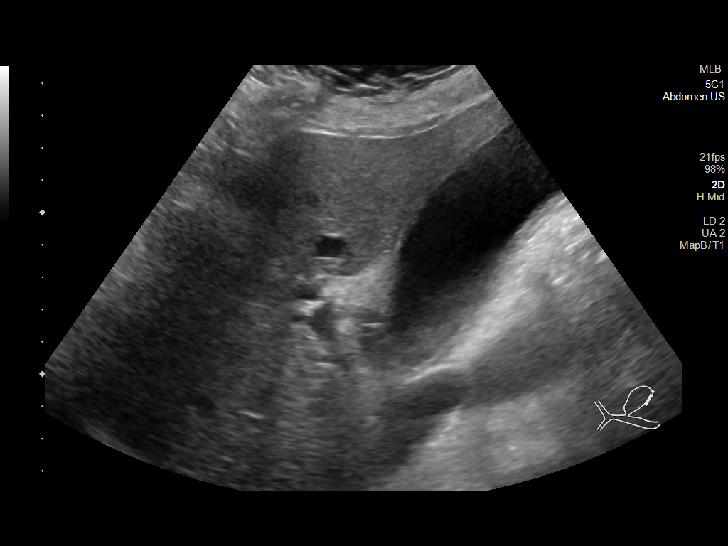
[im 24/58]
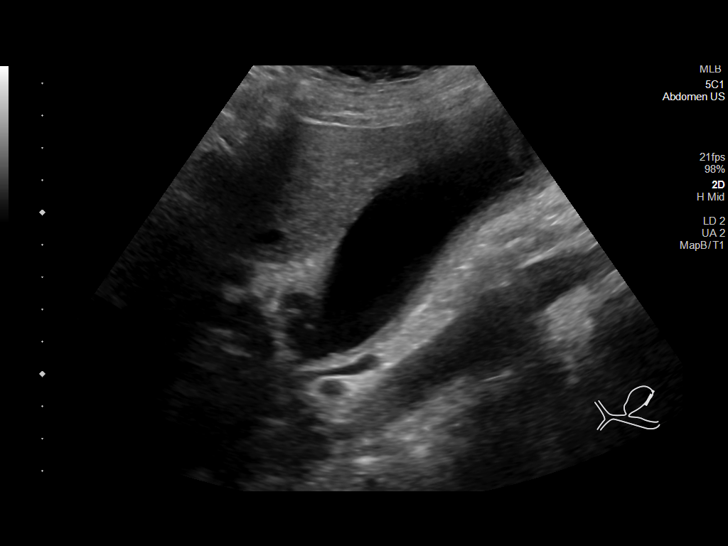
[im 29/58]
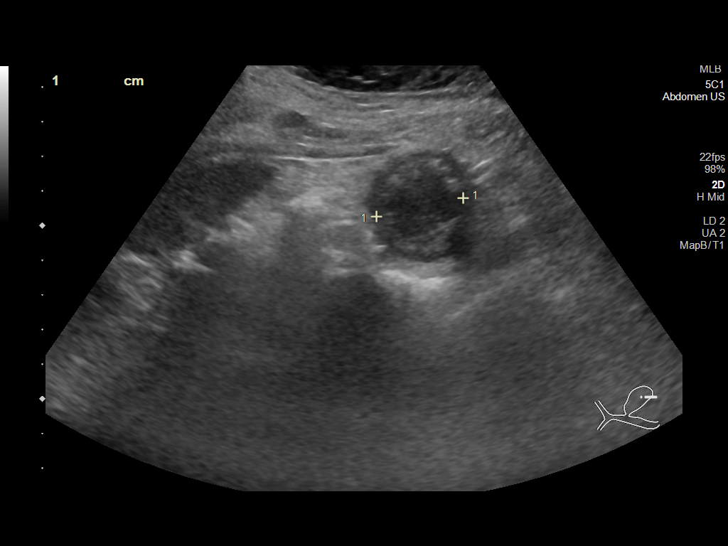
[im 34/58]
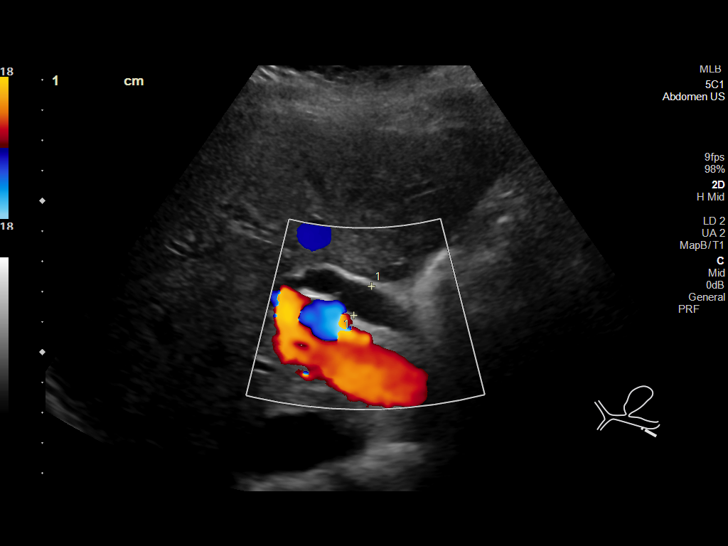
[im 39/58]
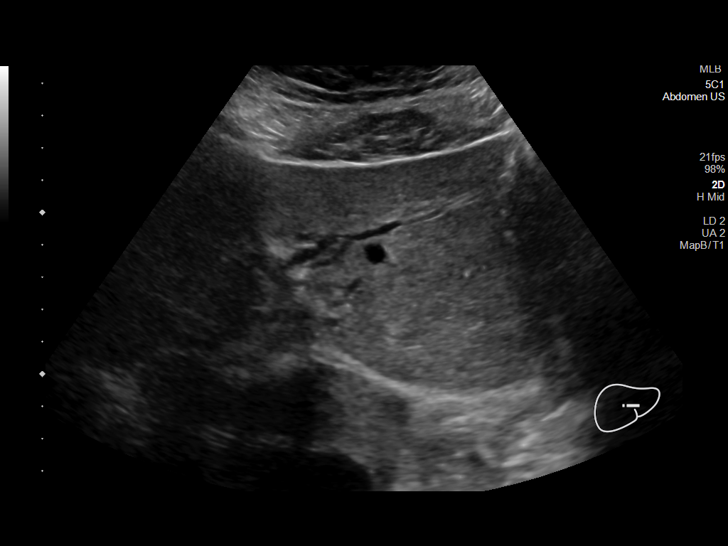
[im 43/58]
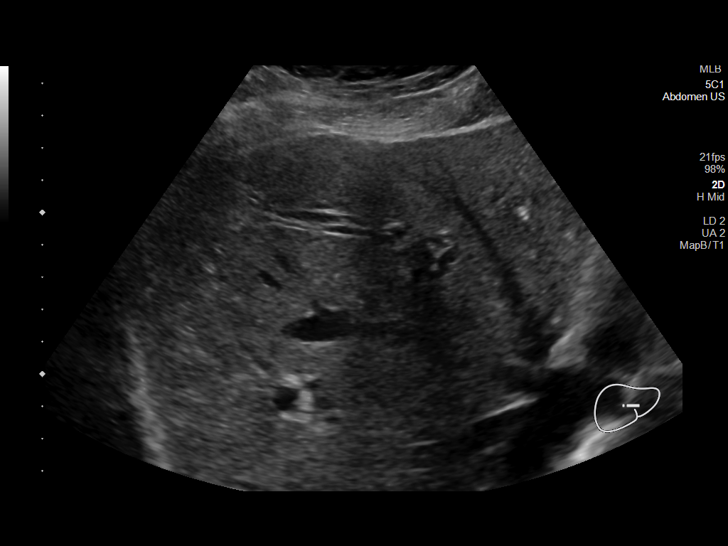
[im 48/58]
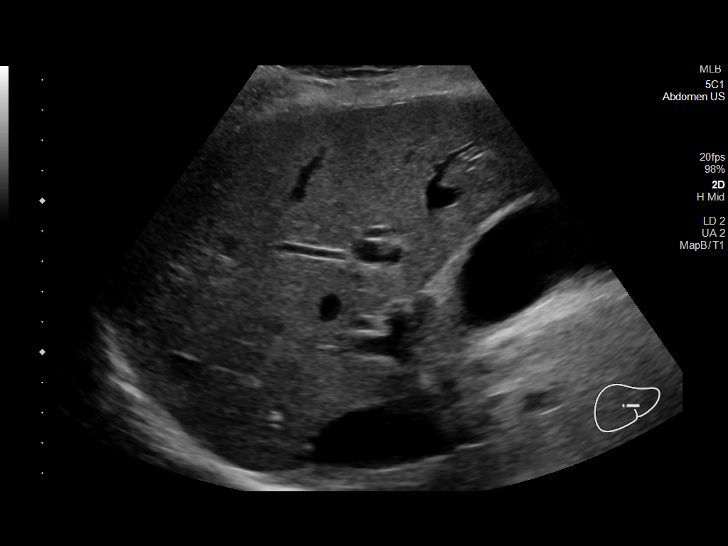
[im 53/58]
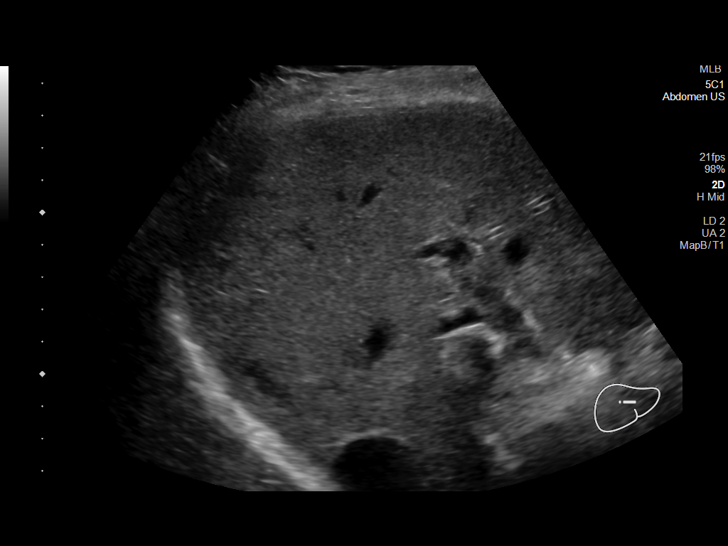
[im 58/58]
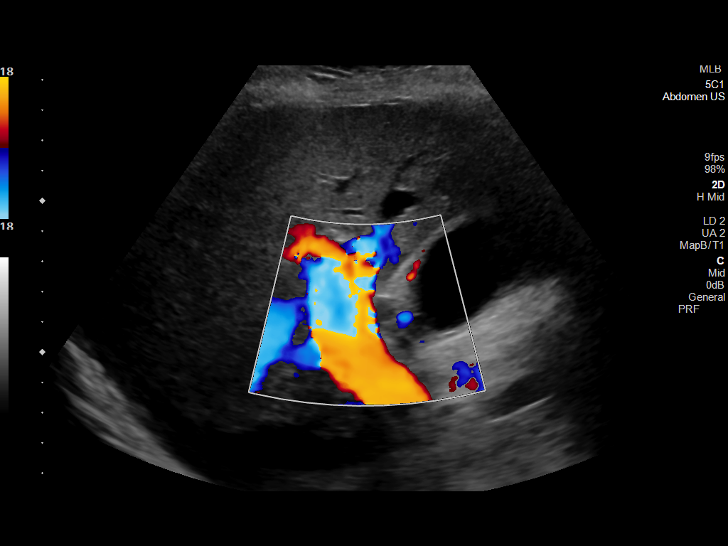

[13 of 25 positions shown; findings below may reference images not displayed]

FINDINGS: Gallbladder:

Gallbladder wall thickening. Masslike thickening of the gallbladder
fundus with associated calcifications. Positive Murphy sign noted by
sonographer.

Common bile duct:

Diameter: 11.3 mm

A stone is seen in the common bile duct.

Liver:

No focal lesion identified. Within normal limits in parenchymal
echogenicity. Portal vein is patent on color Doppler imaging with
normal direction of blood flow towards the liver.

Other: None.
IMPRESSION: 1. Gallbladder wall thickening with positive sonographic Murphy
sign, findings can be seen in the setting of acute cholecystitis.
2. Dilated common bile duct with common bile duct stone visualized,
findings are compatible with choledocholithiasis.
3. Masslike thickening of the gallbladder fundus with associated
calcifications, differential considerations include localized
adenomyomatosis, non mobile tumefactive sludge, or gallbladder mass.
MRI with contrast could be performed for further evaluation.

ADDENDUM:
I was asked review this case. No common duct stone is demonstrated
on this study. The common bile duct is, however, dilated and a
distal stone is not excluded. In reviewing the clinical notes, the
plan is for ERCP prior to cholecystectomy.

*** End of Addendum ***
FINDINGS: Gallbladder:

Gallbladder wall thickening. Masslike thickening of the gallbladder
fundus with associated calcifications. Positive Murphy sign noted by
sonographer.

Common bile duct:

Diameter: 11.3 mm

A stone is seen in the common bile duct.

Liver:

No focal lesion identified. Within normal limits in parenchymal
echogenicity. Portal vein is patent on color Doppler imaging with
normal direction of blood flow towards the liver.

Other: None.
IMPRESSION: 1. Gallbladder wall thickening with positive sonographic Murphy
sign, findings can be seen in the setting of acute cholecystitis.
2. Dilated common bile duct with common bile duct stone visualized,
findings are compatible with choledocholithiasis.
3. Masslike thickening of the gallbladder fundus with associated
calcifications, differential considerations include localized
adenomyomatosis, non mobile tumefactive sludge, or gallbladder mass.
MRI with contrast could be performed for further evaluation.
